# Patient Record
Sex: Male | Born: 1960 | Race: White | Hispanic: No | Marital: Single | Smoking: Current every day smoker
Health system: Southern US, Community
[De-identification: ages and names within clinical notes are randomized; demographics above are authoritative.]

## PROBLEM LIST (undated history)

## (undated) ENCOUNTER — Emergency Department: Discharge status: Absent without leave | Payer: Medicare Other

## (undated) DIAGNOSIS — K219 Gastro-esophageal reflux disease without esophagitis: Secondary | ICD-10-CM

## (undated) DIAGNOSIS — N529 Male erectile dysfunction, unspecified: Secondary | ICD-10-CM

## (undated) DIAGNOSIS — C801 Malignant (primary) neoplasm, unspecified: Secondary | ICD-10-CM

## (undated) DIAGNOSIS — I10 Essential (primary) hypertension: Secondary | ICD-10-CM

## (undated) DIAGNOSIS — D649 Anemia, unspecified: Secondary | ICD-10-CM

## (undated) HISTORY — PX: BACK SURGERY: SHX140

## (undated) HISTORY — PX: NECK SURGERY: SHX720

---

## 2013-10-08 ENCOUNTER — Ambulatory Visit: Payer: Self-pay | Admitting: Internal Medicine

## 2014-05-14 ENCOUNTER — Ambulatory Visit
Admit: 2014-05-14 | Disposition: A | Discharge status: Home / Self Care | Payer: Self-pay | Attending: Gastroenterology | Admitting: Gastroenterology

## 2014-05-20 LAB — SURGICAL PATHOLOGY

## 2014-06-05 ENCOUNTER — Emergency Department: Payer: Medicare Other

## 2014-06-05 ENCOUNTER — Encounter: Payer: Self-pay | Admitting: *Deleted

## 2014-06-05 ENCOUNTER — Inpatient Hospital Stay
Admission: EM | Admit: 2014-06-05 | Discharge: 2014-06-07 | DRG: 643 | Disposition: A | Discharge status: Home / Self Care | Payer: Medicare Other | Attending: Internal Medicine | Admitting: Internal Medicine

## 2014-06-05 DIAGNOSIS — E222 Syndrome of inappropriate secretion of antidiuretic hormone: Secondary | ICD-10-CM | POA: Diagnosis not present

## 2014-06-05 DIAGNOSIS — G9341 Metabolic encephalopathy: Secondary | ICD-10-CM | POA: Diagnosis present

## 2014-06-05 DIAGNOSIS — K219 Gastro-esophageal reflux disease without esophagitis: Secondary | ICD-10-CM | POA: Diagnosis present

## 2014-06-05 DIAGNOSIS — I10 Essential (primary) hypertension: Secondary | ICD-10-CM | POA: Diagnosis present

## 2014-06-05 DIAGNOSIS — R4182 Altered mental status, unspecified: Secondary | ICD-10-CM | POA: Diagnosis not present

## 2014-06-05 DIAGNOSIS — F17210 Nicotine dependence, cigarettes, uncomplicated: Secondary | ICD-10-CM | POA: Diagnosis present

## 2014-06-05 DIAGNOSIS — G9340 Encephalopathy, unspecified: Secondary | ICD-10-CM | POA: Diagnosis present

## 2014-06-05 DIAGNOSIS — E785 Hyperlipidemia, unspecified: Secondary | ICD-10-CM | POA: Diagnosis present

## 2014-06-05 DIAGNOSIS — E871 Hypo-osmolality and hyponatremia: Secondary | ICD-10-CM | POA: Diagnosis present

## 2014-06-05 DIAGNOSIS — T502X5A Adverse effect of carbonic-anhydrase inhibitors, benzothiadiazides and other diuretics, initial encounter: Secondary | ICD-10-CM | POA: Diagnosis present

## 2014-06-05 HISTORY — DX: Gastro-esophageal reflux disease without esophagitis: K21.9

## 2014-06-05 HISTORY — DX: Essential (primary) hypertension: I10

## 2014-06-05 LAB — CBC WITH DIFFERENTIAL/PLATELET
BASOS ABS: 0.1 10*3/uL (ref 0–0.1)
BASOS PCT: 1 %
EOS ABS: 0.1 10*3/uL (ref 0–0.7)
EOS PCT: 2 %
HEMATOCRIT: 37.2 % — AB (ref 40.0–52.0)
HEMOGLOBIN: 13.2 g/dL (ref 13.0–18.0)
Lymphocytes Relative: 13 %
Lymphs Abs: 1 10*3/uL (ref 1.0–3.6)
MCH: 29.3 pg (ref 26.0–34.0)
MCHC: 35.4 g/dL (ref 32.0–36.0)
MCV: 82.8 fL (ref 80.0–100.0)
Monocytes Absolute: 1 10*3/uL (ref 0.2–1.0)
Monocytes Relative: 13 %
Neutro Abs: 5.2 10*3/uL (ref 1.4–6.5)
Neutrophils Relative %: 71 %
PLATELETS: 307 10*3/uL (ref 150–440)
RBC: 4.49 MIL/uL (ref 4.40–5.90)
RDW: 16.1 % — AB (ref 11.5–14.5)
WBC: 7.4 10*3/uL (ref 3.8–10.6)

## 2014-06-05 LAB — AMMONIA: AMMONIA: 10 umol/L (ref 9–35)

## 2014-06-05 LAB — LACTIC ACID, PLASMA: Lactic Acid, Venous: 1.8 mmol/L (ref 0.5–2.0)

## 2014-06-05 MED ORDER — LORAZEPAM 2 MG/ML IJ SOLN
1.0000 mg | Freq: Once | INTRAMUSCULAR | Status: AC
  Administered 2014-06-05: 1 mg via INTRAVENOUS

## 2014-06-05 MED ORDER — SODIUM CHLORIDE 0.9 % IV BOLUS (SEPSIS)
1000.0000 mL | Freq: Once | INTRAVENOUS | Status: AC
  Administered 2014-06-05: 1000 mL via INTRAVENOUS

## 2014-06-05 MED ORDER — LORAZEPAM 2 MG/ML IJ SOLN
INTRAMUSCULAR | Status: AC
  Filled 2014-06-05: qty 1

## 2014-06-05 MED ORDER — LORAZEPAM 2 MG/ML IJ SOLN
INTRAMUSCULAR | Status: AC
  Administered 2014-06-05: 1 mg via INTRAVENOUS
  Filled 2014-06-05: qty 1

## 2014-06-05 MED ORDER — HALOPERIDOL LACTATE 5 MG/ML IJ SOLN
10.0000 mg | Freq: Once | INTRAMUSCULAR | Status: AC
  Administered 2014-06-05: 10 mg via INTRAVENOUS

## 2014-06-05 MED ORDER — HALOPERIDOL LACTATE 5 MG/ML IJ SOLN
INTRAMUSCULAR | Status: AC
  Administered 2014-06-05: 10 mg via INTRAVENOUS
  Filled 2014-06-05: qty 2

## 2014-06-05 NOTE — ED Notes (Signed)
Pt went to CT but CT was not performed d/t pt not lying still. Ativan '1mg'$  was given minutes prior to pt going to CT. Pt back to room with ED tech. ED tech remains with pt.

## 2014-06-05 NOTE — ED Notes (Signed)
Pt was discovered by his brother this evening to be confused and not making any sense. Pt alert but not oriented. Pt will answer when you call his name but cannot tell his name or DOB. Pt was last known normal yest at his PCP. Pt has equal grips and steady gait, speech normal, no facial droop observed. Pt continually wants to leave and states he is going home now but with redirection pt will remain cooperative and not try to leave. ED tech remains with pt at this time for safety.

## 2014-06-05 NOTE — ED Notes (Signed)
Pt's brother Gershon Mussel at bedside.

## 2014-06-05 NOTE — ED Provider Notes (Signed)
[facility] Emergency Department Provider Note  ____________________________________________  Time seen: Approximately 7:43 PM  I have reviewed the triage vital signs and the nursing notes.   HISTORY  Chief Complaint No chief complaint on file.  Altered Mental status  HPI Micah Barnier is a 54 y.o. male EMS called by patient's brother who reports he has altered mental status was last seen normal at the doctor's office yesterday when he had his regular medications. Unsure of the start of the altered mental status patient himself complains of pain in his right knee will not let me move his right knee EMS reports he was moving his knees well when he was walking and walked without difficulty to the truck although he changed his mind and walk back and forth several times this reports he became combative at one point as well he himself denies headache fever nausea vomiting diarrhea told me initially his right knee hurt and said both knees hurt and see her density hurt all over and then said he felt fine patient is usually responding in 1-2 word sentences, monotone voice M unable to this get any other history out of him  Patient is to combative and restless to lay still 1 mg of Ativan IV Ativan did not control him so he was given another milligram of IV Ativan and IV Haldol is let's him relax and CT is able to be accomplished we are still waiting on labs and will I will sign out the patient to Dr. Owens Shark for further disposition anticipate admitting this patient   No past medical history on file.  There are no active problems to display for this patient.   No past surgical history on file.  No current outpatient prescriptions on file.  Allergies Review of patient's allergies indicates not on file.  No family history on file.  Social History History  Substance Use Topics  . Smoking status: Not on file  . Smokeless tobacco: Not on file  . Alcohol Use: Not on file     Review of Systems Review of systems is limited severely by patient's altered mental status fact that he cannot give me the same answer twice ____________________________________________   PHYSICAL EXAM: There were no vitals taken for this visit.VITAL SIGNS:BP 117/65 mmHg  Pulse 60  Temp(Src) 98.2 F (36.8 C) (Oral)  Resp 12  SpO2 98% ED Triage Vitals  Enc Vitals Group     BP --      Pulse --      Resp --      Temp --      Temp src --      SpO2 --      Weight --      Height --      Head Cir --      Peak Flow --      Pain Score --      Pain Loc --      Pain Edu? --      Excl. in [place]? --     Constitutional: Alert and oriented. Well appearing and in no acute distress. Eyes: Conjunctivae are normal. PERRL. EOMI. Head: Atraumatic. Nose: No congestion/rhinnorhea. Mouth/Throat: Mucous membranes are dry   Oropharynx non-erythematous. Neck: No stridor.  unable to manipulate patient's neck because he won't let me but he moves his neck and normally himself Cardiovascular: Normal rate, regular rhythm. Grossly normal heart sounds.  Good peripheral circulation. Respiratory: Normal respiratory effort.  No retractions. Lungs CTAB. Gastrointestinal: Soft and  nontender. No distention. No abdominal bruits. No CVA tenderness. Musculoskeletal: No lower extremity tenderness nor edema.  No joint effusions. Neurologic:  Normal speech and language. No gross focal neurologic deficits are appreciated.No gait instability. Skin:  Skin is warm, dry and intact. No rash noted. however his skin is erythematous as like he's been out in the sun all day    ____________________________________________   LABS (all labs ordered are listed, but only abnormal results are displayed)  Labs Reviewed  CBC WITH DIFFERENTIAL/PLATELET  COMPREHENSIVE METABOLIC PANEL  AMMONIA  LACTIC ACID, PLASMA  LACTIC ACID, PLASMA  TROPONIN I  URINALYSIS COMPLETEWITH MICROSCOPIC ([facility])   URINE DRUG SCREEN,  QUALITATIVE ([facility])  CBG MONITORING, ED   ____________________________________________  EKG normal sinus rhythm at 81 normal axis and no acute changes normal QRS duration EKG is computer reads septal infarct that's probably due to lead placement____________________________________________  RADIOLOGY  CT read by radiologist as no acute disease discussed in detail with radiologist who again feels the slight hypodensity in the frontal lobe area is artifact from theskull ____________________________________________   PROCEDURES  Procedure(s) performed: None  Critical Care performed: No  ____________________________________________   INITIAL IMPRESSION / ASSESSMENT AND PLAN / ED COURSE  Pertinent labs & imaging results that were available during my care of the patient were reviewed by me and considered in my medical decision making (see chart for details).   ____________________________________________   FINAL CLINICAL IMPRESSION(S) / ED DIAGNOSES  Final diagnoses:  None     Nena Polio, MD 06/05/14 2322

## 2014-06-05 NOTE — ED Notes (Signed)
This RN spoke with pt's brother, Ra Pfiester. Tom states pt was nl when he left for work at [employer] this a.m. Pt was quiet and not acting quite right when he arrived home. Brother called EMS. Brother advised that further tests are pending but I will call him if pt is admitted or when he is ready for d/c. Tom agreeable with this and is going home at this time. Atilano Median [phone]. Pt is asleep at this time. Cardiac monitor remains in place, door to room open.

## 2014-06-05 NOTE — ED Notes (Signed)
Patient is resting comfortably. Ed tech remains at bedside.

## 2014-06-05 NOTE — ED Notes (Signed)
Patient transported to X-ray 

## 2014-06-05 NOTE — ED Notes (Signed)
Bed alarm in place. Door to room open. Cardiac monitor in remains in place.

## 2014-06-06 ENCOUNTER — Encounter: Payer: Self-pay | Admitting: Internal Medicine

## 2014-06-06 DIAGNOSIS — E222 Syndrome of inappropriate secretion of antidiuretic hormone: Secondary | ICD-10-CM | POA: Diagnosis present

## 2014-06-06 DIAGNOSIS — E785 Hyperlipidemia, unspecified: Secondary | ICD-10-CM | POA: Diagnosis present

## 2014-06-06 DIAGNOSIS — G9340 Encephalopathy, unspecified: Secondary | ICD-10-CM | POA: Diagnosis present

## 2014-06-06 DIAGNOSIS — T502X5A Adverse effect of carbonic-anhydrase inhibitors, benzothiadiazides and other diuretics, initial encounter: Secondary | ICD-10-CM | POA: Diagnosis present

## 2014-06-06 DIAGNOSIS — K219 Gastro-esophageal reflux disease without esophagitis: Secondary | ICD-10-CM | POA: Diagnosis present

## 2014-06-06 DIAGNOSIS — F17210 Nicotine dependence, cigarettes, uncomplicated: Secondary | ICD-10-CM | POA: Diagnosis present

## 2014-06-06 DIAGNOSIS — E871 Hypo-osmolality and hyponatremia: Secondary | ICD-10-CM | POA: Diagnosis present

## 2014-06-06 DIAGNOSIS — I10 Essential (primary) hypertension: Secondary | ICD-10-CM | POA: Diagnosis present

## 2014-06-06 DIAGNOSIS — R4182 Altered mental status, unspecified: Secondary | ICD-10-CM | POA: Diagnosis present

## 2014-06-06 DIAGNOSIS — G9341 Metabolic encephalopathy: Secondary | ICD-10-CM | POA: Diagnosis present

## 2014-06-06 LAB — COMPREHENSIVE METABOLIC PANEL
ALT: 14 U/L — ABNORMAL LOW (ref 17–63)
ALT: 11 U/L — ABNORMAL LOW (ref 17–63)
AST: 29 U/L (ref 15–41)
AST: 20 U/L (ref 15–41)
Albumin: 4.8 g/dL (ref 3.5–5.0)
Albumin: 3.9 g/dL (ref 3.5–5.0)
Alkaline Phosphatase: 115 U/L (ref 38–126)
Alkaline Phosphatase: 104 U/L (ref 38–126)
Anion gap: 19 — ABNORMAL HIGH (ref 5–15)
Anion gap: 8 (ref 5–15)
BILIRUBIN TOTAL: 0.8 mg/dL (ref 0.3–1.2)
BUN: 15 mg/dL (ref 6–20)
BUN: 13 mg/dL (ref 6–20)
CALCIUM: 9.6 mg/dL (ref 8.9–10.3)
CHLORIDE: 85 mmol/L — AB (ref 101–111)
CO2: 18 mmol/L — ABNORMAL LOW (ref 22–32)
CO2: 26 mmol/L (ref 22–32)
CREATININE: 0.86 mg/dL (ref 0.61–1.24)
Calcium: 8.5 mg/dL — ABNORMAL LOW (ref 8.9–10.3)
Chloride: 85 mmol/L — ABNORMAL LOW (ref 101–111)
Creatinine, Ser: 1.15 mg/dL (ref 0.61–1.24)
GFR calc Af Amer: >60 mL/min (ref >60)
GFR calc non Af Amer: >60 mL/min (ref >60)
GLUCOSE: 135 mg/dL — AB (ref 65–99)
Glucose, Bld: 92 mg/dL (ref 65–99)
POTASSIUM: 3.5 mmol/L (ref 3.5–5.1)
Potassium: 3.5 mmol/L (ref 3.5–5.1)
SODIUM: 122 mmol/L — AB (ref 135–145)
Sodium: 119 mmol/L — CL (ref 135–145)
TOTAL PROTEIN: 8.5 g/dL — AB (ref 6.5–8.1)
Total Bilirubin: 0.2 mg/dL — ABNORMAL LOW (ref 0.3–1.2)
Total Protein: 7.1 g/dL (ref 6.5–8.1)

## 2014-06-06 LAB — BASIC METABOLIC PANEL
ANION GAP: 5 (ref 5–15)
BUN: 11 mg/dL (ref 6–20)
CALCIUM: 8.5 mg/dL — AB (ref 8.9–10.3)
CO2: 26 mmol/L (ref 22–32)
CREATININE: 0.79 mg/dL (ref 0.61–1.24)
Chloride: 91 mmol/L — ABNORMAL LOW (ref 101–111)
GFR calc Af Amer: >60 mL/min (ref >60)
Glucose, Bld: 106 mg/dL — ABNORMAL HIGH (ref 65–99)
Potassium: 4 mmol/L (ref 3.5–5.1)
Sodium: 122 mmol/L — ABNORMAL LOW (ref 135–145)

## 2014-06-06 LAB — TROPONIN I
Troponin I: <0.03 ng/mL (ref <0.031)
Troponin I: <0.03 ng/mL (ref <0.031)

## 2014-06-06 LAB — CK: CK TOTAL: 122 U/L (ref 49–397)

## 2014-06-06 LAB — NA AND K (SODIUM & POTASSIUM), RAND UR
Potassium Urine: 6 mmol/L
SODIUM UR: 39 mmol/L

## 2014-06-06 LAB — URINALYSIS COMPLETE WITH MICROSCOPIC (ARMC ONLY)
BACTERIA UA: NONE SEEN
Bilirubin Urine: NEGATIVE
GLUCOSE, UA: NEGATIVE mg/dL
HGB URINE DIPSTICK: NEGATIVE
KETONES UR: NEGATIVE mg/dL
Leukocytes, UA: NEGATIVE
Nitrite: NEGATIVE
Protein, ur: NEGATIVE mg/dL
RBC / HPF: NONE SEEN RBC/hpf (ref 0–5)
SPECIFIC GRAVITY, URINE: 1.011 (ref 1.005–1.030)
SQUAMOUS EPITHELIAL / LPF: NONE SEEN
pH: 6 (ref 5.0–8.0)

## 2014-06-06 LAB — URINE DRUG SCREEN, QUALITATIVE (ARMC ONLY)
Amphetamines, Ur Screen: NOT DETECTED
BENZODIAZEPINE, UR SCRN: NOT DETECTED
Barbiturates, Ur Screen: NOT DETECTED
CANNABINOID 50 NG, UR ~~LOC~~: NOT DETECTED
Cocaine Metabolite,Ur ~~LOC~~: NOT DETECTED
MDMA (ECSTASY) UR SCREEN: NOT DETECTED
Methadone Scn, Ur: NOT DETECTED
Opiate, Ur Screen: NOT DETECTED
Phencyclidine (PCP) Ur S: NOT DETECTED
TRICYCLIC, UR SCREEN: POSITIVE — AB

## 2014-06-06 LAB — CHLORIDE, URINE, RANDOM: Chloride Urine: 40 mmol/L

## 2014-06-06 LAB — TSH: TSH: 2.29 u[IU]/mL (ref 0.350–4.500)

## 2014-06-06 LAB — OSMOLALITY, URINE: Osmolality, Ur: 156 mOsm/kg — ABNORMAL LOW (ref 300–900)

## 2014-06-06 LAB — ETHANOL: Alcohol, Ethyl (B): <5 mg/dL (ref <5)

## 2014-06-06 MED ORDER — SODIUM CHLORIDE 0.9 % IV SOLN
1000.0000 mL | Freq: Once | INTRAVENOUS | Status: AC
  Administered 2014-06-06: 1000 mL via INTRAVENOUS

## 2014-06-06 MED ORDER — SODIUM CHLORIDE 0.9 % IV SOLN
1000.0000 mL | INTRAVENOUS | Status: DC
  Administered 2014-06-06: 1000 mL via INTRAVENOUS

## 2014-06-06 MED ORDER — AMLODIPINE BESYLATE 5 MG PO TABS
5.0000 mg | ORAL_TABLET | Freq: Every day | ORAL | Status: DC
  Administered 2014-06-06 – 2014-06-07 (×2): 5 mg via ORAL
  Filled 2014-06-06 (×2): qty 1

## 2014-06-06 MED ORDER — ENOXAPARIN SODIUM 40 MG/0.4ML ~~LOC~~ SOLN
40.0000 mg | SUBCUTANEOUS | Status: DC
  Administered 2014-06-06 – 2014-06-07 (×2): 40 mg via SUBCUTANEOUS
  Filled 2014-06-06 (×2): qty 0.4

## 2014-06-06 MED ORDER — ONDANSETRON HCL 4 MG PO TABS: 4.0000 mg | ORAL_TABLET | Freq: Four times a day (QID) | ORAL | Status: DC | PRN

## 2014-06-06 MED ORDER — PANTOPRAZOLE SODIUM 40 MG PO TBEC
40.0000 mg | DELAYED_RELEASE_TABLET | Freq: Every day | ORAL | Status: DC
  Administered 2014-06-06 – 2014-06-07 (×2): 40 mg via ORAL
  Filled 2014-06-06 (×2): qty 1

## 2014-06-06 MED ORDER — SODIUM CHLORIDE 0.9 % IJ SOLN
3.0000 mL | Freq: Two times a day (BID) | INTRAMUSCULAR | Status: DC
  Administered 2014-06-06 – 2014-06-07 (×2): 3 mL via INTRAVENOUS

## 2014-06-06 MED ORDER — ALBUTEROL SULFATE HFA 108 (90 BASE) MCG/ACT IN AERS: 2.0000 | INHALATION_SPRAY | Freq: Four times a day (QID) | RESPIRATORY_TRACT | Status: DC | PRN

## 2014-06-06 MED ORDER — ATORVASTATIN CALCIUM 20 MG PO TABS
20.0000 mg | ORAL_TABLET | Freq: Every day | ORAL | Status: DC
  Administered 2014-06-06: 20 mg via ORAL
  Filled 2014-06-06: qty 1

## 2014-06-06 MED ORDER — ASPIRIN EC 81 MG PO TBEC
81.0000 mg | DELAYED_RELEASE_TABLET | Freq: Every day | ORAL | Status: DC
  Administered 2014-06-06 – 2014-06-07 (×2): 81 mg via ORAL
  Filled 2014-06-06 (×2): qty 1

## 2014-06-06 MED ORDER — ACETAMINOPHEN 325 MG PO TABS: 650.0000 mg | ORAL_TABLET | Freq: Four times a day (QID) | ORAL | Status: DC | PRN

## 2014-06-06 MED ORDER — ONDANSETRON HCL 4 MG/2ML IJ SOLN: 4.0000 mg | Freq: Four times a day (QID) | INTRAMUSCULAR | Status: DC | PRN

## 2014-06-06 MED ORDER — ALBUTEROL SULFATE (2.5 MG/3ML) 0.083% IN NEBU: 2.5000 mg | INHALATION_SOLUTION | Freq: Four times a day (QID) | RESPIRATORY_TRACT | Status: DC | PRN

## 2014-06-06 MED ORDER — ACETAMINOPHEN 650 MG RE SUPP: 650.0000 mg | Freq: Four times a day (QID) | RECTAL | Status: DC | PRN

## 2014-06-06 MED ORDER — LISINOPRIL 20 MG PO TABS
20.0000 mg | ORAL_TABLET | Freq: Every day | ORAL | Status: DC
  Administered 2014-06-06 – 2014-06-07 (×2): 20 mg via ORAL
  Filled 2014-06-06 (×2): qty 1

## 2014-06-06 NOTE — ED Notes (Signed)
Admitting MD at bedside.

## 2014-06-06 NOTE — H&P (Signed)
[facility] History and Physical  Shizuo Biskup OIB:[id number] DOB: 1961/01/14 DOA: 06/05/2014  Referring physician: Dr. Owens Shark PCP: No primary care provider on file. [facility]  Chief Complaint: Altered mental status  HPI: Steven Davis is a 54 y.o. male below past medical history brought in with the complaints of altered mental status as noted by his brother. The ED physician's note patient was agitated and combative on arrival . Workup revealed a CT head which is negative for any acute intracranial pathology. Patient was monitored in the emergency room and his altered sensorium gradually improved. No history of any fever, cough, shortness of breath, nausea vomiting diarrhea constipation. His lab work came back and was significant for hyponatremia. Rest of his labs were normal including EtOH level and urine toxicology screen . Patient was given normal saline bolus and hospitalist service was consulted for further management. At the current time patient is alert awake and oriented 3, denies any complaints, states he wants to go home .  Past Medical History  Diagnosis Date  . Hypertension   . GERD (gastroesophageal reflux disease)     Past Surgical History  Procedure Laterality Date  . Back surgery      Family History  Problem Relation Age of Onset  . Hypertension Father     Social History  reports that he has been smoking.  He does not have any smokeless tobacco history on file. He reports that he does not drink alcohol. His drug history is not on file.  No Known Allergies  Prior to Admission medications   Medication Sig Start Date End Date Taking? Authorizing Provider  albuterol (PROVENTIL HFA;VENTOLIN HFA) 108 (90 BASE) MCG/ACT inhaler Inhale 2 puffs into the lungs every 6 (six) hours as needed for wheezing or shortness of breath.   Yes Historical Provider, MD  amLODipine (NORVASC) 5 MG tablet Take 5 mg by mouth daily.   Yes Historical Provider, MD  atorvastatin  (LIPITOR) 20 MG tablet Take 20 mg by mouth at bedtime.   Yes Historical Provider, MD  cyclobenzaprine (FLEXERIL) 10 MG tablet Take 10 mg by mouth 4 (four) times daily.   Yes Historical Provider, MD  lisinopril-hydrochlorothiazide (PRINZIDE,ZESTORETIC) 20-12.5 MG per tablet Take 2 tablets by mouth daily.   Yes Historical Provider, MD  omeprazole (PRILOSEC) 40 MG capsule Take 40 mg by mouth daily.   Yes Historical Provider, MD  tadalafil (CIALIS) 10 MG tablet Take 10 mg by mouth daily as needed for erectile dysfunction.   Yes Historical Provider, MD     Review of Systems: Patient was noted to be with altered sensorium on arrival in the ED by the ED physician. Currently patient is alert awake and oriented. Constitutional: No fevers, chills, fatigue, weakness.  Eyes: No blurred or double vision, pain, redness, inflammation. ENT: No tinnitus, ear pain, hearing loss, epistaxis, discharge, redness of oropharynx, difficulty swallowing. Resp: No cough, wheeze, hemoptysis, dyspnea, painful respiration. Cardio-vascular: No chest pain, orthopnea, edema, DOE, palpitations, syncope. GI: No nausea, vomiting, diarrhea, abdominal pain, hematemesis, melena, GERD, rectal bleeding, constipation. GU: No dysuria, hematuria, urgency, frequency, incontinence Endocrine: No polyuria, nocturia, heat or cold intolerance, thirst Hematologic/Lymphatic: No anemia, easy bruising bleeding, swollen glands Integumentary: No acne, rash, lesions. Musculoskeletal: No pain in: neck-back-shoulder-knee-hip, arthritis, gout, redness. Neuro: No numbness, weakness, dysarthria, epilepsy, tremor, vertigo, ataxia, dementia, headache, migraine, CVA, TIA, seizure, memory loss Psych: No anxiety, insomnia, ADD, OCD, bipolar, depression, schizophrenia.   Physical Exam: Constitutional: Filed Vitals:   06/06/14 0300 06/06/14 0400 06/06/14 0500  06/06/14 0600  BP: 103/67 114/70 137/78 106/64  Pulse: 58  63 63  Temp:      TempSrc:       Resp: '12 14 14 12  '$ SpO2: 98%  97% 97%   Wt Readings from Last 3 Encounters:  No data found for Wt   General:  Well developed, well nourished,  in no apparent distress HEENT: PERRL, EOMI, no scleral icterus, no conjunctivitis, no difficulty hearing, no pharyngeal erythema, Mucosa - moist. Neck:  Supple, no masses, non tender, no adenopathy, no JVD, no Carotid bruit, no thyromegaly,FROM Cardiovascular: RRR, no m/r/g, no S3/S4, chest wall nontender, good pedal pulses, good femoral pulses, no LE edema. Respiratory: CTA bilaterally, no wheeze, no rales, no ronchi, breath sounds not diminished, breathing not labored, no increased respiratory effort. Abdomen: soft, nontender, nondistended, no mass, bowel sounds present and normoactive, no hepatosplenomegaly. GU/Gyn: Not examined. Musculoskeletal: 5/5 muscular strength x4 extremities, no cyanosis/clubbing. Skin: no rash, no lesions, no erythema. Lymphatic: No adenopathy (cervical/axilla/inguinal/supraclavicular) Neurologic: Cranial nerves intact, DTR intact, Sensation intact, Babinski sign R/L, no dysarthria, no aphasia, no dysphagia, no contractures Psychiatric: Alert, Oriented (time, person, place, circumstance), Cooperative, Judgement good, Memory intact, not confused, not agitated, not depressed, cooperative         Labs on Admission:  Basic Metabolic Panel:  Recent Labs Lab 06/06/14 0500  NA 119*  K 3.5  CL 85*  CO2 26  GLUCOSE 92  BUN 13  CREATININE 0.86  CALCIUM 8.5*   Liver Function Tests:  Recent Labs Lab 06/06/14 0500  AST 20  ALT 11*  ALKPHOS 104  BILITOT 0.8  PROT 7.1  ALBUMIN 3.9   No results for input(s): LIPASE, AMYLASE in the last 168 hours.  Recent Labs Lab 06/05/14 2030  AMMONIA 10   CBC:  Recent Labs Lab 06/05/14 1949  WBC 7.4  NEUTROABS 5.2  HGB 13.2  HCT 37.2*  MCV 82.8  PLT 307   Cardiac Enzymes:  Recent Labs Lab 06/05/14 1949 06/06/14 0500  CKTOTAL 122  --   TROPONINI  --   <0.03   BNP (last 3 results) No results for input(s): BNP in the last 8760 hours.  ProBNP (last 3 results) No results for input(s): PROBNP in the last 8760 hours.  CBG: No results for input(s): GLUCAP in the last 168 hours.  Radiological Exams on Admission: Ct Head Wo Contrast  06/05/2014   CLINICAL DATA:  Altered mental status.  EXAM: CT HEAD WITHOUT CONTRAST  TECHNIQUE: Contiguous axial images were obtained from the base of the skull through the vertex without intravenous contrast.  COMPARISON:  None.  FINDINGS: Images are somewhat limited due to persistent patient motion artifact. Bony calvarium appears grossly intact. No mass effect or midline shift is noted. Ventricular size is within normal limits. There is no evidence of mass lesion, hemorrhage or acute infarction.  IMPRESSION: No gross intracranial abnormality seen.   Electronically Signed   By: Marijo Conception, M.D.   On: 06/05/2014 20:34   Dg Chest Portable 1 View  06/05/2014   CLINICAL DATA:  Altered mental status.  Hypertension.  EXAM: PORTABLE CHEST - 1 VIEW  COMPARISON:  None.  FINDINGS: The heart size and mediastinal contours are within normal limits. Both lungs are clear. Posterior spinal fixation rods noted in the lower thoracic spine.  IMPRESSION: No active disease.   Electronically Signed   By: Earle Gell M.D.   On: 06/05/2014 20:12    EKG: Independently reviewed. Normal sinus  rhythm with ventricular rate of 87 bpm. No acute ischemic changes.  Assessment/Plan Principal Problem:   Hyponatremia Active Problems:   Acute encephalopathy   HTN (hypertension)   GERD (gastroesophageal reflux disease)   1. Acute encephalopathy likely secondary to hyponatremia. Currently patient is alert awake and oriented 3. 2. Hyponatremia, etiology not clear at this time. Likely secondary to diuretic usage. 3. Hypertension, controlled on home medications.  4. Hyperlipidemia, stable on statin. 5. Gastroesophageal reflux disease, stable  on PPI. 6. Tobacco usage, continuous patient not motivated to quit.  Plan: Admit to medical floor, telemetry monitoring, neuro watch, seizure precautions. Continue IV normal saline, follow-up CMP. Check TSH. Hold diuretics for now, continue other home medications.   Code Status: Full code DVT Prophylaxis: SubQ lovenox  Time spent on admission: 50 minutes.  Juluis Mire [facility]

## 2014-06-06 NOTE — Care Management Note (Signed)
Case Management Note  Patient Details  Name: Steven Davis MRN: [id number] Date of Birth: 20-Jan-1961  Subjective/Objective:                  Patient awake but dosing off during [facility] assessment. Brother at bedside. Patient answered questions related to [facility] assessment. Patient states he lives with this brother, active, drives, and able to perform ADLs without assistance. His PCP is with [facility] and he states that he can afford his Rx. He states that he "drinks non-alcohol beers" but "has a history of alcohol abuse". His brother offered no insight. He states his Sodium has dropped like this in the past. Patient eager to return home.  Action/Plan: No RNCM needs.   Expected Discharge Date:                  Expected Discharge Plan:  Home/Self Care  In-House Referral:     Discharge planning Services  CM Consult  Post Acute Care Choice:    Choice offered to:  Patient, Sibling  DME Arranged:    DME Agency:     HH Arranged:    [place] Agency:     Status of Service:  Completed, signed off  Medicare Important Message Given:    Date Medicare IM Given:    Medicare IM give by:    Date Additional Medicare IM Given:    Additional Medicare Important Message give by:     If discussed at [place] of Stay Meetings, dates discussed:    Additional Comments:  Marshell Garfinkel, RN 06/06/2014, 10:24 AM

## 2014-06-06 NOTE — Progress Notes (Signed)
[place] [facility] [place] NAME: Steven Davis    MR#:  [id number]  DATE OF BIRTH:  1961-01-02  SUBJECTIVE:  CHIEF COMPLAINT:   Chief Complaint  Patient presents with  . Altered Mental Status    REVIEW OF SYSTEMS:  Review of Systems  Constitutional: Negative for fever, chills and weight loss.  HENT: Negative for nosebleeds and sore throat.   Eyes: Negative for blurred vision.  Respiratory: Negative for cough, shortness of breath and wheezing.   Cardiovascular: Negative for chest pain, orthopnea, leg swelling and PND.  Gastrointestinal: Negative for heartburn, nausea, vomiting, abdominal pain, diarrhea and constipation.  Genitourinary: Negative for dysuria and urgency.  Musculoskeletal: Negative for back pain.  Skin: Negative for rash.  Neurological: Negative for dizziness, speech change, focal weakness and headaches.  Endo/Heme/Allergies: Does not bruise/bleed easily.  Psychiatric/Behavioral: Negative for depression.    DRUG ALLERGIES:  No Known Allergies  VITALS:  Blood pressure 141/82, pulse 77, temperature 97.8 F (36.6 C), temperature source Oral, resp. rate 18, weight 69.854 kg (154 lb), SpO2 97 %.  PHYSICAL EXAMINATION:  GENERAL:  54 y.o.-year-old patient lying in the bed with no acute distress.  EYES: Pupils equal, round, reactive to light and accommodation. No scleral icterus. Extraocular muscles intact.  HEENT: Head atraumatic, normocephalic. Oropharynx and nasopharynx clear.  NECK:  Supple, no jugular venous distention. No thyroid enlargement, no tenderness.  LUNGS: Normal breath sounds bilaterally, no wheezing, rales,rhonchi or crepitation. No use of accessory muscles of respiration.  CARDIOVASCULAR: S1, S2 normal. No murmurs, rubs, or gallops.  ABDOMEN: Soft, nontender, nondistended. Bowel sounds present. No organomegaly or mass.  EXTREMITIES: No pedal edema, cyanosis, or clubbing.  NEUROLOGIC: Cranial nerves II  through XII are intact. Muscle strength 5/5 in all extremities. Sensation intact. Gait not checked.  PSYCHIATRIC: The patient is alert and oriented x 3. He does doze off very easily while talking.  SKIN: No obvious rash, lesion, or ulcer.    LABORATORY PANEL:   CBC  Recent Labs Lab 06/05/14 1949  WBC 7.4  HGB 13.2  HCT 37.2*  PLT 307   ------------------------------------------------------------------------------------------------------------------  Chemistries   Recent Labs Lab 06/06/14 0500  NA 119*  K 3.5  CL 85*  CO2 26  GLUCOSE 92  BUN 13  CREATININE 0.86  CALCIUM 8.5*  AST 20  ALT 11*  ALKPHOS 104  BILITOT 0.8   ------------------------------------------------------------------------------------------------------------------  Cardiac Enzymes  Recent Labs Lab 06/06/14 0500  TROPONINI <0.03   ------------------------------------------------------------------------------------------------------------------  RADIOLOGY:  Ct Head Wo Contrast  06/05/2014   CLINICAL DATA:  Altered mental status.  EXAM: CT HEAD WITHOUT CONTRAST  TECHNIQUE: Contiguous axial images were obtained from the base of the skull through the vertex without intravenous contrast.  COMPARISON:  None.  FINDINGS: Images are somewhat limited due to persistent patient motion artifact. Bony calvarium appears grossly intact. No mass effect or midline shift is noted. Ventricular size is within normal limits. There is no evidence of mass lesion, hemorrhage or acute infarction.  IMPRESSION: No gross intracranial abnormality seen.   Electronically Signed   By: Marijo Conception, M.D.   On: 06/05/2014 20:34   Dg Chest Portable 1 View  06/05/2014   CLINICAL DATA:  Altered mental status.  Hypertension.  EXAM: PORTABLE CHEST - 1 VIEW  COMPARISON:  None.  FINDINGS: The heart size and mediastinal contours are within normal limits. Both lungs are clear. Posterior spinal fixation rods noted in the lower thoracic  spine.  IMPRESSION: No active disease.   Electronically Signed   By: Earle Gell M.D.   On: 06/05/2014 20:12    ASSESSMENT AND PLAN:   1. Acute encephalopathy likely multifactorial, possibly secondary to hyponatremia. Currently patient is alert awake and oriented 3. 2. Acute on chronic Hyponatremia, etiology not clear at this time. Likely secondary to diuretic usage. Consult nephrology. This is likely acute on chronic. 3. Hypertension, controlled on home medications.  4. Hyperlipidemia, stable on statin. 5. Gastroesophageal reflux disease, stable on PPI. 6. Tobacco usage, continuous patient not motivated to quit. He was counseled for 3 minutes not ready to quit. Denies any need for nicotine replacement therapy     All the records are reviewed and case discussed with Care Management/Social Workerr. Management plans discussed with the patient, family and they are in agreement.  CODE STATUS: Full  TOTAL TIME TAKING CARE OF THIS PATIENT: 45 minutes.   POSSIBLE D/C IN 1-2 DAYS, DEPENDING ON CLINICAL CONDITION.   [facility], Emet Rafanan M.D on 06/06/2014 at 12:33 PM  Between 7am to 6pm - Pager - [phone]  After 6pm go to [email or web address] - password [email or web address] [email or web address]  [facility]  Office  [phone]  CC: Primary care physician; No primary care provider on file.

## 2014-06-06 NOTE — ED Notes (Signed)
Pt assisted up to bedside with urinal. Urine specimen obtained and sent to lab. Pt able to answer questions app. Pt back in bed asleep, cardiac monitor in place. Pt has no other needs or concerns at this time.

## 2014-06-06 NOTE — Consult Note (Signed)
Date: 06/06/2014                  Patient Name:  Steven Davis  MRN: [id number]  DOB: 1960/05/05  Age / Sex: 54 y.o., male         PCP: No primary care provider on file.                 Service Requesting Consult: IM                 Reason for Consult: hyponatremia            History of Present Illness: Patient is a 54 y.o. male with a PMHx of HTN, who was admitted to [facility] on 06/05/2014 for evaluation of hyponatremia.  He first presented to the ER with agitation and he was combative upon arrival. CT head neg. Treated with iv fluids. Mental status improved in the ER At present seems to be at his baseline. Talking on the phone. Normally conversive. Denies acute problems. No baseline labs available at this time. Admit Na was 122 Same level this AM Patient denies excessive water intake.  He was on Lisinopril-HCTZ which is in hold States his meds have been the same for the last 6 months Medications: Outpatient medications: Prescriptions prior to admission  Medication Sig Dispense Refill Last Dose  . albuterol (PROVENTIL HFA;VENTOLIN HFA) 108 (90 BASE) MCG/ACT inhaler Inhale 2 puffs into the lungs every 6 (six) hours as needed for wheezing or shortness of breath.   unknown  . amLODipine (NORVASC) 5 MG tablet Take 5 mg by mouth daily.   unknown  . atorvastatin (LIPITOR) 20 MG tablet Take 20 mg by mouth at bedtime.   unknown  . cyclobenzaprine (FLEXERIL) 10 MG tablet Take 10 mg by mouth 4 (four) times daily.   unknown  . lisinopril-hydrochlorothiazide (PRINZIDE,ZESTORETIC) 20-12.5 MG per tablet Take 2 tablets by mouth daily.   unknown  . omeprazole (PRILOSEC) 40 MG capsule Take 40 mg by mouth daily.   unknown  . tadalafil (CIALIS) 10 MG tablet Take 10 mg by mouth daily as needed for erectile dysfunction.   unknown    Current medications: Current Facility-Administered Medications  Medication Dose Route Frequency Provider Last Rate Last Dose  . 0.9 %  sodium chloride infusion  1,000 mL  Intravenous Once Gregor Hams, MD       Followed by  . 0.9 %  sodium chloride infusion  1,000 mL Intravenous Continuous Gregor Hams, MD 125 mL/hr at 06/06/14 0902 1,000 mL at 06/06/14 0902  . acetaminophen (TYLENOL) tablet 650 mg  650 mg Oral Q6H PRN Juluis Mire, MD       Or  . acetaminophen (TYLENOL) suppository 650 mg  650 mg Rectal Q6H PRN Juluis Mire, MD      . albuterol (PROVENTIL) (2.5 MG/3ML) 0.083% nebulizer solution 2.5 mg  2.5 mg Nebulization Q6H PRN Juluis Mire, MD      . amLODipine (NORVASC) tablet 5 mg  5 mg Oral Daily Juluis Mire, MD   5 mg at 06/06/14 1045  . aspirin EC tablet 81 mg  81 mg Oral Daily Juluis Mire, MD   81 mg at 06/06/14 1045  . atorvastatin (LIPITOR) tablet 20 mg  20 mg Oral QHS Juluis Mire, MD      . enoxaparin (LOVENOX) injection 40 mg  40 mg Subcutaneous Q24H Juluis Mire, MD   40 mg at 06/06/14 1045  .  lisinopril (PRINIVIL,ZESTRIL) tablet 20 mg  20 mg Oral Daily Juluis Mire, MD   20 mg at 06/06/14 1045  . ondansetron (ZOFRAN) tablet 4 mg  4 mg Oral Q6H PRN Juluis Mire, MD       Or  . ondansetron [facility]) injection 4 mg  4 mg Intravenous Q6H PRN Juluis Mire, MD      . pantoprazole (PROTONIX) EC tablet 40 mg  40 mg Oral Daily Juluis Mire, MD   40 mg at 06/06/14 1045  . sodium chloride 0.9 % injection 3 mL  3 mL Intravenous Q12H Juluis Mire, MD   3 mL at 06/06/14 1044      Allergies: No Known Allergies    Past Medical History: Past Medical History  Diagnosis Date  . Hypertension   . GERD (gastroesophageal reflux disease)      Past Surgical History: Past Surgical History  Procedure Laterality Date  . Back surgery       Family History: Family History  Problem Relation Age of Onset  . Hypertension Father      Social History: History   Social History  . Marital Status: Single    Spouse Name: N/A  . Number of Children: N/A  . Years of Education: N/A   Occupational  History  . Not on file.   Social History Main Topics  . Smoking status: Current Every Day Smoker  . Smokeless tobacco: Not on file  . Alcohol Use: No  . Drug Use: Not on file  . Sexual Activity: Not on file   Other Topics Concern  . Not on file   Social History Narrative     Review of Systems: As per HPI otherwise neg. Gen: no c/o Eyes no c/o Ent: no c/o Resp: no c/o Cvs: no c/o Gi: no c/i Gu: no c/o Musck: no c/o Skin: no c/o Neuro: no c/o  Vital Signs: Blood pressure 114/64, pulse 83, temperature 98.4 F (36.9 C), temperature source Oral, resp. rate 18, weight 69.854 kg (154 lb), SpO2 95 %.  Weight trends: Filed Weights   06/06/14 0400  Weight: 69.854 kg (154 lb)    Physical Exam: General: NAD,   Head: Normocephalic, atraumatic.  Eyes: Anicteric, EOMI  Nose: Mucous membranes moist, not inflammed, nonerythematous.  Throat: Oropharynx nonerythematous, no exudate appreciated.   Neck: No deformities, masses, Supple,  Lungs:  Normal respiratory effort.   Heart: RRR. S1 and S2 normal without gallop, murmur, or rubs.  Abdomen:  BS normoactive. Soft, Nondistended, non-tender.  No masses or organomegaly.  Extremities: No pretibial edema.  Neurologic: Alert, oriented, non focal  Skin: No visible rashes, scars.    Lab results: Basic Metabolic Panel:  Recent Labs Lab 06/05/14 1949 06/06/14 0500 06/06/14 1336  NA 122* 119* 122*  K 3.5 3.5 4.0  CL 85* 85* 91*  CO2 18* 26 26  GLUCOSE 135* 92 106*  BUN '15 13 11  '$ CREATININE 1.15 0.86 0.79  CALCIUM 9.6 8.5* 8.5*    Liver Function Tests:  Recent Labs Lab 06/05/14 1949 06/06/14 0500  AST 29 20  ALT 14* 11*  ALKPHOS 115 104  BILITOT 0.2* 0.8  PROT 8.5* 7.1  ALBUMIN 4.8 3.9   No results for input(s): LIPASE, AMYLASE in the last 168 hours.  Recent Labs Lab 06/05/14 2030  AMMONIA 10    CBC:  Recent Labs Lab 06/05/14 1949  WBC 7.4  NEUTROABS 5.2  HGB 13.2  HCT 37.2*  MCV 82.8  PLT 307     Cardiac Enzymes:  Recent Labs Lab 06/05/14 1949 06/06/14 0500  CKTOTAL 122  --   TROPONINI <0.03 <0.03    BNP: Invalid input(s): POCBNP  CBG: No results for input(s): GLUCAP in the last 168 hours.  Microbiology: No results found for this or any previous visit.  Coagulation Studies: No results for input(s): LABPROT, INR in the last 72 hours.  Urinalysis:  Recent Labs  06/06/14 0130  COLORURINE YELLOW*  LABSPEC 1.011  PHURINE 6.0  GLUCOSEU NEGATIVE  HGBUR NEGATIVE  BILIRUBINUR NEGATIVE  KETONESUR NEGATIVE  PROTEINUR NEGATIVE  NITRITE NEGATIVE  LEUKOCYTESUR NEGATIVE      Imaging: Ct Head Wo Contrast  06/05/2014   CLINICAL DATA:  Altered mental status.  EXAM: CT HEAD WITHOUT CONTRAST  TECHNIQUE: Contiguous axial images were obtained from the base of the skull through the vertex without intravenous contrast.  COMPARISON:  None.  FINDINGS: Images are somewhat limited due to persistent patient motion artifact. Bony calvarium appears grossly intact. No mass effect or midline shift is noted. Ventricular size is within normal limits. There is no evidence of mass lesion, hemorrhage or acute infarction.  IMPRESSION: No gross intracranial abnormality seen.   Electronically Signed   By: Marijo Conception, M.D.   On: 06/05/2014 20:34   Dg Chest Portable 1 View  06/05/2014   CLINICAL DATA:  Altered mental status.  Hypertension.  EXAM: PORTABLE CHEST - 1 VIEW  COMPARISON:  None.  FINDINGS: The heart size and mediastinal contours are within normal limits. Both lungs are clear. Posterior spinal fixation rods noted in the lower thoracic spine.  IMPRESSION: No active disease.   Electronically Signed   By: Earle Gell M.D.   On: 06/05/2014 20:12      Assessment & Plan: Pt is a 54 y.o. yo male with a PMHX of HTN, was admitted to [facility] on 06/05/2014 with altered mental status.   1. Hyponatremia. Unclear cause but likely SIADH vs HCTZ effect. Patient's Na level has not responded to iv  fluids.  TSH is normal CXR is neg for any lung pathology Altered mental status has also resolved  Plan for 1 dose of vaprisol

## 2014-06-07 LAB — CHLORIDE, URINE, RANDOM: Chloride Urine: 38 mmol/L

## 2014-06-07 LAB — BASIC METABOLIC PANEL
Anion gap: 5 (ref 5–15)
BUN: 9 mg/dL (ref 6–20)
CHLORIDE: 98 mmol/L — AB (ref 101–111)
CO2: 22 mmol/L (ref 22–32)
CREATININE: 0.74 mg/dL (ref 0.61–1.24)
Calcium: 8.2 mg/dL — ABNORMAL LOW (ref 8.9–10.3)
GFR calc non Af Amer: >60 mL/min (ref >60)
Glucose, Bld: 87 mg/dL (ref 65–99)
POTASSIUM: 3.8 mmol/L (ref 3.5–5.1)
Sodium: 125 mmol/L — ABNORMAL LOW (ref 135–145)

## 2014-06-07 LAB — OSMOLALITY, URINE: OSMOLALITY UR: 156 mosm/kg — AB (ref 300–900)

## 2014-06-07 NOTE — Outcomes Assessment (Signed)
VSS, patient is A+O with no signs of distress. Denies pain at this time. Appears to have slept well.  SR on tele in the 80's.  Voids without difficulty. Up to BR per self.

## 2014-06-07 NOTE — Progress Notes (Signed)
Patient was educated about follow-up appointment. Reviewed discharge instructions. Patient left unit in wheelchair with volunteer.  Arber Wiemers, RN

## 2014-06-07 NOTE — Discharge Summary (Signed)
[place] [facility] [place] NAME: Steven Davis    MR#:  [id number]  DATE OF BIRTH:  03-29-1960  DATE OF ADMISSION:  06/05/2014 ADMITTING PHYSICIAN: Max Sane, MD  DATE OF DISCHARGE: 06/07/2014  PRIMARY CARE PHYSICIAN: [facility]    ADMISSION DIAGNOSIS:  Altered mental status, unspecified altered mental status type [R41.82]  DISCHARGE DIAGNOSIS:  1. Acute encephalopathy likely multifactorial, possibly secondary to hyponatremia.  2. Acute on chronic Hyponatremia  SECONDARY DIAGNOSIS:   Past Medical History  Diagnosis Date  . Hypertension   . GERD (gastroesophageal reflux disease)     HOSPITAL COURSE:   54 year old male being admitted for acute metabolic encephalopathy likely due to severe hyponatremia. see Dr. Reddy's dictated history and physical for further details.  1.   Acute metabolic encephalopathy likely multifactorial, possibly secondary to hyponatremia. Currently patient is alert awake and oriented 3. 2.   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 3. Hypertension, controlled on home medications.  4. Hyperlipidemia, stable on statin. 5. Gastroesophageal reflux disease, stable on PPI. 6. Tobacco usage, continuous patient not motivated to quit. He was counseled for 3 minutes not ready to quit. Denies any need for nicotine replacement therapy  He is feeling much better. Sodium is close to his baseline. is being discharged home in stable condition. Agreeable with  Discharge plans.   CONSULTS OBTAINED:  Treatment Team:  Murlean Iba, MD  DRUG ALLERGIES:  No Known Allergies  DISCHARGE MEDICATIONS:   Current Discharge Medication List    CONTINUE these medications which have NOT CHANGED   Details  albuterol (PROVENTIL HFA;VENTOLIN HFA) 108 (90 BASE) MCG/ACT inhaler Inhale 2 puffs into the lungs every 6 (six) hours as needed for wheezing or shortness of breath.     amLODipine (NORVASC) 5 MG tablet Take 5 mg by mouth daily.    atorvastatin (LIPITOR) 20 MG tablet Take 20 mg by mouth at bedtime.    cyclobenzaprine (FLEXERIL) 10 MG tablet Take 10 mg by mouth 4 (four) times daily.    lisinopril-hydrochlorothiazide (PRINZIDE,ZESTORETIC) 20-12.5 MG per tablet Take 2 tablets by mouth daily.    omeprazole (PRILOSEC) 40 MG capsule Take 40 mg by mouth daily.    tadalafil (CIALIS) 10 MG tablet Take 10 mg by mouth daily as needed for erectile dysfunction.         DISCHARGE INSTRUCTIONS:     DIET:  Regular diet  DISCHARGE CONDITION:  Good  ACTIVITY:  Activity as tolerated  OXYGEN:  Home Oxygen: No.   Oxygen Delivery: room air  DISCHARGE LOCATION:  home   If you experience worsening of your admission symptoms, develop shortness of breath, life threatening emergency, suicidal or homicidal thoughts you must seek medical attention immediately by calling 911 or calling your MD immediately  if symptoms less severe.  You Must read complete instructions/literature along with all the possible adverse reactions/side effects for all the Medicines you take and that have been prescribed to you. Take any new Medicines after you have completely understood and accpet all the possible adverse reactions/side effects.   Please note  You were cared for by a hospitalist during your hospital stay. If you have any questions about your discharge medications or the care you received while you were in the hospital after you are discharged, you can call the unit and asked to speak with the hospitalist on call if the hospitalist that took care of you is not available.  Once you are discharged, your primary care physician will handle any further medical issues. Please note that NO REFILLS for any discharge medications will be authorized once you are discharged, as it is imperative that you return to your primary care physician (or establish a relationship with a primary care  physician if you do not have one) for your aftercare needs so that they can reassess your need for medications and monitor your lab values.     On the date of discharge:   VITAL SIGNS:  Blood pressure 151/81, pulse 74, temperature 97.6 F (36.4 C), temperature source Oral, resp. rate 18, weight 69.48 kg (153 lb 2.8 oz), SpO2 98 %.  I/O:   Intake/Output Summary (Last 24 hours) at 06/07/14 1011 Last data filed at 06/07/14 0400  Gross per 24 hour  Intake   3250 ml  Output   2355 ml  Net    895 ml    PHYSICAL EXAMINATION:  GENERAL:  54 y.o.-year-old patient lying in the bed with no acute distress.  EYES: Pupils equal, round, reactive to light and accommodation. No scleral icterus. Extraocular muscles intact.  HEENT: Head atraumatic, normocephalic. Oropharynx and nasopharynx clear.  NECK:  Supple, no jugular venous distention. No thyroid enlargement, no tenderness.  LUNGS: Normal breath sounds bilaterally, no wheezing, rales,rhonchi or crepitation. No use of accessory muscles of respiration.  CARDIOVASCULAR: S1, S2 normal. No murmurs, rubs, or gallops.  ABDOMEN: Soft, non-tender, non-distended. Bowel sounds present. No organomegaly or mass.  EXTREMITIES: No pedal edema, cyanosis, or clubbing.  NEUROLOGIC: Cranial nerves II through XII are intact. Muscle strength 5/5 in all extremities. Sensation intact. Gait not checked.  PSYCHIATRIC: The patient is alert and oriented x 3.  SKIN: No obvious rash, lesion, or ulcer.   DATA REVIEW:   CBC  Recent Labs Lab 06/05/14 1949  WBC 7.4  HGB 13.2  HCT 37.2*  PLT 307    Chemistries   Recent Labs Lab 06/06/14 0500  06/07/14 0546  NA 119*  < > 125*  K 3.5  < > 3.8  CL 85*  < > 98*  CO2 26  < > 22  GLUCOSE 92  < > 87  BUN 13  < > 9  CREATININE 0.86  < > 0.74  CALCIUM 8.5*  < > 8.2*  AST 20  --   --   ALT 11*  --   --   ALKPHOS 104  --   --   BILITOT 0.8  --   --   < > = values in this interval not  displayed.  Microbiology Results  No results found for this or any previous visit.  RADIOLOGY:  Ct Head Wo Contrast  06/05/2014   CLINICAL DATA:  Altered mental status.  EXAM: CT HEAD WITHOUT CONTRAST  TECHNIQUE: Contiguous axial images were obtained from the base of the skull through the vertex without intravenous contrast.  COMPARISON:  None.  FINDINGS: Images are somewhat limited due to persistent patient motion artifact. Bony calvarium appears grossly intact. No mass effect or midline shift is noted. Ventricular size is within normal limits. There is no evidence of mass lesion, hemorrhage or acute infarction.  IMPRESSION: No gross intracranial abnormality seen.   Electronically Signed   By: Marijo Conception, M.D.   On: 06/05/2014 20:34   Dg Chest Portable 1 View  06/05/2014   CLINICAL DATA:  Altered mental status.  Hypertension.  EXAM: PORTABLE CHEST - 1 VIEW  COMPARISON:  None.  FINDINGS:  The heart size and mediastinal contours are within normal limits. Both lungs are clear. Posterior spinal fixation rods noted in the lower thoracic spine.  IMPRESSION: No active disease.   Electronically Signed   By: Earle Gell M.D.   On: 06/05/2014 20:12   Management plans discussed with the patient, family and they are in agreement.  CODE STATUS:     Code Status Orders        Start     Ordered   06/06/14 0810  Full code   Continuous     06/06/14 0809      TOTAL TIME TAKING CARE OF THIS PATIENT: 35 minutes.    [facility], Mishka Stegemann M.D on 06/07/2014 at 10:11 AM  Between 7am to 6pm - Pager - [phone]  After 6pm go to [email or web address] - password [email or web address] [facility]  [facility]  Office  [phone]  CC: Primary care physician; [place] [facility]

## 2014-06-10 ENCOUNTER — Emergency Department
Admission: EM | Admit: 2014-06-10 | Discharge: 2014-06-10 | Disposition: A | Discharge status: Home / Self Care | Payer: Medicare Other | Attending: Emergency Medicine | Admitting: Emergency Medicine

## 2014-06-10 ENCOUNTER — Other Ambulatory Visit: Payer: Self-pay

## 2014-06-10 ENCOUNTER — Encounter: Payer: Self-pay | Admitting: Emergency Medicine

## 2014-06-10 DIAGNOSIS — Z79899 Other long term (current) drug therapy: Secondary | ICD-10-CM | POA: Diagnosis not present

## 2014-06-10 DIAGNOSIS — E871 Hypo-osmolality and hyponatremia: Secondary | ICD-10-CM | POA: Insufficient documentation

## 2014-06-10 DIAGNOSIS — R4182 Altered mental status, unspecified: Secondary | ICD-10-CM | POA: Diagnosis present

## 2014-06-10 DIAGNOSIS — I10 Essential (primary) hypertension: Secondary | ICD-10-CM | POA: Insufficient documentation

## 2014-06-10 DIAGNOSIS — Z720 Tobacco use: Secondary | ICD-10-CM | POA: Diagnosis not present

## 2014-06-10 HISTORY — DX: Male erectile dysfunction, unspecified: N52.9

## 2014-06-10 LAB — CBC
HCT: 35.8 % — ABNORMAL LOW (ref 40.0–52.0)
Hemoglobin: 12.1 g/dL — ABNORMAL LOW (ref 13.0–18.0)
MCH: 28.5 pg (ref 26.0–34.0)
MCHC: 33.8 g/dL (ref 32.0–36.0)
MCV: 84.4 fL (ref 80.0–100.0)
PLATELETS: 349 10*3/uL (ref 150–440)
RBC: 4.24 MIL/uL — AB (ref 4.40–5.90)
RDW: 16.2 % — AB (ref 11.5–14.5)
WBC: 9.1 10*3/uL (ref 3.8–10.6)

## 2014-06-10 LAB — URINALYSIS COMPLETE WITH MICROSCOPIC (ARMC ONLY)
BILIRUBIN URINE: NEGATIVE
Bacteria, UA: NONE SEEN
Glucose, UA: NEGATIVE mg/dL
Hgb urine dipstick: NEGATIVE
LEUKOCYTES UA: NEGATIVE
Nitrite: NEGATIVE
PH: 6 (ref 5.0–8.0)
PROTEIN: NEGATIVE mg/dL
RBC / HPF: NONE SEEN RBC/hpf (ref 0–5)
Specific Gravity, Urine: 1.006 (ref 1.005–1.030)
Squamous Epithelial / LPF: NONE SEEN

## 2014-06-10 LAB — COMPREHENSIVE METABOLIC PANEL
ALK PHOS: 95 U/L (ref 38–126)
ALT: 10 U/L — ABNORMAL LOW (ref 17–63)
ANION GAP: 11 (ref 5–15)
AST: 21 U/L (ref 15–41)
Albumin: 4.1 g/dL (ref 3.5–5.0)
BILIRUBIN TOTAL: 0.7 mg/dL (ref 0.3–1.2)
BUN: 15 mg/dL (ref 6–20)
CO2: 22 mmol/L (ref 22–32)
Calcium: 8.6 mg/dL — ABNORMAL LOW (ref 8.9–10.3)
Chloride: 88 mmol/L — ABNORMAL LOW (ref 101–111)
Creatinine, Ser: 1 mg/dL (ref 0.61–1.24)
GFR calc non Af Amer: >60 mL/min (ref >60)
Glucose, Bld: 91 mg/dL (ref 65–99)
Potassium: 3.8 mmol/L (ref 3.5–5.1)
Sodium: 121 mmol/L — ABNORMAL LOW (ref 135–145)
Total Protein: 7.6 g/dL (ref 6.5–8.1)

## 2014-06-10 LAB — MAGNESIUM: MAGNESIUM: 1.6 mg/dL — AB (ref 1.7–2.4)

## 2014-06-10 MED ORDER — SODIUM CHLORIDE 0.9 % IV BOLUS (SEPSIS)
1000.0000 mL | Freq: Once | INTRAVENOUS | Status: AC
  Administered 2014-06-10: 1000 mL via INTRAVENOUS
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
## 2014-06-10 NOTE — Discharge Instructions (Signed)
Please seek medical attention for any high fevers, chest pain, shortness of breath, change in behavior, persistent vomiting, bloody stool or any other new or concerning symptoms.  Hyponatremia  Hyponatremia is when the amount of salt (sodium) in your blood is too low. When sodium levels are low, your cells will absorb extra water and swell. The swelling happens throughout the body, but it mostly affects the brain. Severe brain swelling (cerebral edema), seizures, or coma can happen.  CAUSES   Heart, kidney, or liver problems.  Thyroid problems.  Adrenal gland problems.  Severe vomiting and diarrhea.  Certain medicines or illegal drugs.  Dehydration.  Drinking too much water.  Low-sodium diet. SYMPTOMS   Nausea and vomiting.  Confusion.  Lethargy.  Agitation.  Headache.  Twitching or shaking (seizures).  Unconsciousness.  Appetite loss.  Muscle weakness and cramping. DIAGNOSIS  Hyponatremia is identified by a simple blood test. Your caregiver will perform a history and physical exam to try to find the cause and type of hyponatremia. Other tests may be needed to measure the amount of sodium in your blood and urine. TREATMENT  Treatment will depend on the cause.   Fluids may be given through the vein (IV).  Medicines may be used to correct the sodium imbalance. If medicines are causing the problem, they will need to be adjusted.  Water or fluid intake may be restricted to restore proper balance. The speed of correcting the sodium problem is very important. If the problem is corrected too fast, nerve damage (sometimes unchangeable) can happen. HOME CARE INSTRUCTIONS   Only take medicines as directed by your caregiver. Many medicines can make hyponatremia worse. Discuss all your medicines with your caregiver.  Carefully follow any recommended diet, including any fluid restrictions.  You may be asked to repeat lab tests. Follow these directions.  Avoid alcohol and  recreational drugs. SEEK MEDICAL CARE IF:   You develop worsening nausea, fatigue, headache, confusion, or weakness.  Your original hyponatremia symptoms return.  You have problems following the recommended diet. SEEK IMMEDIATE MEDICAL CARE IF:   You have a seizure.  You faint.  You have ongoing diarrhea or vomiting. MAKE SURE YOU:   Understand these instructions.  Will watch your condition.  Will get help right away if you are not doing well or get worse. Document Released: 01/01/2002 Document Revised: 04/05/2011 Document Reviewed: 06/28/2010 [facility] Patient Information 2015 [place], [place]. This information is not intended to replace advice given to you by your health care provider. Make sure you discuss any questions you have with your health care provider.

## 2014-06-10 NOTE — ED Notes (Signed)
NS started on patient. Resting comfortably on bed. Call bell at his side. Denies any further needs or concerns at this time.

## 2014-06-12 ENCOUNTER — Emergency Department
Admission: EM | Admit: 2014-06-12 | Discharge: 2014-06-12 | Disposition: A | Discharge status: Home / Self Care | Payer: Medicare Other | Attending: Emergency Medicine | Admitting: Emergency Medicine

## 2014-06-12 ENCOUNTER — Encounter: Payer: Self-pay | Admitting: Emergency Medicine

## 2014-06-12 DIAGNOSIS — Z79899 Other long term (current) drug therapy: Secondary | ICD-10-CM | POA: Diagnosis not present

## 2014-06-12 DIAGNOSIS — L989 Disorder of the skin and subcutaneous tissue, unspecified: Secondary | ICD-10-CM | POA: Insufficient documentation

## 2014-06-12 DIAGNOSIS — H538 Other visual disturbances: Secondary | ICD-10-CM

## 2014-06-12 DIAGNOSIS — E871 Hypo-osmolality and hyponatremia: Secondary | ICD-10-CM | POA: Diagnosis not present

## 2014-06-12 DIAGNOSIS — I10 Essential (primary) hypertension: Secondary | ICD-10-CM | POA: Insufficient documentation

## 2014-06-12 DIAGNOSIS — H547 Unspecified visual loss: Secondary | ICD-10-CM | POA: Diagnosis present

## 2014-06-12 DIAGNOSIS — Z720 Tobacco use: Secondary | ICD-10-CM | POA: Insufficient documentation

## 2014-06-12 LAB — CBC WITH DIFFERENTIAL/PLATELET
Basophils Absolute: 0.1 10*3/uL (ref 0–0.1)
Basophils Relative: 1 %
EOS PCT: 3 %
Eosinophils Absolute: 0.2 10*3/uL (ref 0–0.7)
HEMATOCRIT: 37.5 % — AB (ref 40.0–52.0)
Hemoglobin: 12.7 g/dL — ABNORMAL LOW (ref 13.0–18.0)
LYMPHS ABS: 1.3 10*3/uL (ref 1.0–3.6)
Lymphocytes Relative: 16 %
MCH: 28.7 pg (ref 26.0–34.0)
MCHC: 33.8 g/dL (ref 32.0–36.0)
MCV: 84.7 fL (ref 80.0–100.0)
MONO ABS: 0.6 10*3/uL (ref 0.2–1.0)
Monocytes Relative: 8 %
Neutro Abs: 5.7 10*3/uL (ref 1.4–6.5)
Neutrophils Relative %: 72 %
Platelets: 371 10*3/uL (ref 150–440)
RBC: 4.44 MIL/uL (ref 4.40–5.90)
RDW: 16.8 % — ABNORMAL HIGH (ref 11.5–14.5)
WBC: 7.9 10*3/uL (ref 3.8–10.6)

## 2014-06-12 LAB — COMPREHENSIVE METABOLIC PANEL
ALBUMIN: 4.7 g/dL (ref 3.5–5.0)
ALT: 10 U/L — AB (ref 17–63)
AST: 20 U/L (ref 15–41)
Alkaline Phosphatase: 99 U/L (ref 38–126)
Anion gap: 11 (ref 5–15)
BILIRUBIN TOTAL: 0.5 mg/dL (ref 0.3–1.2)
BUN: 19 mg/dL (ref 6–20)
CHLORIDE: 90 mmol/L — AB (ref 101–111)
CO2: 24 mmol/L (ref 22–32)
CREATININE: 0.94 mg/dL (ref 0.61–1.24)
Calcium: 9.4 mg/dL (ref 8.9–10.3)
GFR calc Af Amer: >60 mL/min (ref >60)
GFR calc non Af Amer: >60 mL/min (ref >60)
Glucose, Bld: 102 mg/dL — ABNORMAL HIGH (ref 65–99)
POTASSIUM: 3.7 mmol/L (ref 3.5–5.1)
SODIUM: 125 mmol/L — AB (ref 135–145)
Total Protein: 8.5 g/dL — ABNORMAL HIGH (ref 6.5–8.1)

## 2014-06-12 LAB — ETHANOL

## 2014-06-12 MED ORDER — DOXYCYCLINE HYCLATE 100 MG PO TABS: 100.0000 mg | ORAL_TABLET | Freq: Two times a day (BID) | ORAL | Status: DC

## 2014-06-12 MED ORDER — LISINOPRIL 20 MG PO TABS: 20.0000 mg | ORAL_TABLET | Freq: Every day | ORAL | Status: DC

## 2014-06-12 MED ORDER — DOXYCYCLINE HYCLATE 100 MG PO TABS
100.0000 mg | ORAL_TABLET | Freq: Once | ORAL | Status: AC
  Administered 2014-06-12: 100 mg via ORAL
  Filled 2014-06-12: qty 1

## 2014-06-12 MED ORDER — SODIUM CHLORIDE 0.9 % IV BOLUS (SEPSIS)
1000.0000 mL | Freq: Once | INTRAVENOUS | Status: AC
  Administered 2014-06-12: 1000 mL via INTRAVENOUS

## 2014-06-12 NOTE — ED Notes (Signed)
Pt discharged home after verbalizing understanding of discharge instructions; nad noted. 

## 2014-06-12 NOTE — Discharge Instructions (Signed)
Blurred Vision Stop taking your lisinopril and HCTZ combination pill. Begin taking the lisinopril only pill prescribed to today. You have been seen today complaining of blurred vision. This means you have a loss of ability to see small details.  CAUSES  Blurred vision can be a symptom of underlying eye problems, such as:  Aging of the eye (presbyopia).  Glaucoma.  Cataracts.  Eye infection.  Eye-related migraine.  Diabetes mellitus.  Fatigue.  Migraine headaches.  High blood pressure.  Breakdown of the back of the eye (macular degeneration).  Problems caused by some medications. The most common cause of blurred vision is the need for eyeglasses or a new prescription. Today in the emergency department, no cause for your blurred vision can be found. SYMPTOMS  Blurred vision is the loss of visual sharpness and detail (acuity). DIAGNOSIS  Should blurred vision continue, you should see your caregiver. If your caregiver is your primary care physician, he or she may choose to refer you to another specialist.  TREATMENT  Do not ignore your blurred vision. Make sure to have it checked out to see if further treatment or referral is necessary. SEEK MEDICAL CARE IF:  You are unable to get into a specialist so we can help you with a referral. SEEK IMMEDIATE MEDICAL CARE IF: You have severe eye pain, severe headache, or sudden loss of vision. MAKE SURE YOU:   Understand these instructions.  Will watch your condition.  Will get help right away if you are not doing well or get worse. Document Released: 01/14/2003 Document Revised: 04/05/2011 Document Reviewed: 08/16/2007 [facility] Patient Information 2015 [place], [place]. This information is not intended to replace advice given to you by your health care provider. Make sure you discuss any questions you have with your health care provider.  Hyponatremia  Hyponatremia is when the amount of salt (sodium) in your blood is too low. When  sodium levels are low, your cells will absorb extra water and swell. The swelling happens throughout the body, but it mostly affects the brain. Severe brain swelling (cerebral edema), seizures, or coma can happen.  CAUSES   Heart, kidney, or liver problems.  Thyroid problems.  Adrenal gland problems.  Severe vomiting and diarrhea.  Certain medicines or illegal drugs.  Dehydration.  Drinking too much water.  Low-sodium diet. SYMPTOMS   Nausea and vomiting.  Confusion.  Lethargy.  Agitation.  Headache.  Twitching or shaking (seizures).  Unconsciousness.  Appetite loss.  Muscle weakness and cramping. DIAGNOSIS  Hyponatremia is identified by a simple blood test. Your caregiver will perform a history and physical exam to try to find the cause and type of hyponatremia. Other tests may be needed to measure the amount of sodium in your blood and urine. TREATMENT  Treatment will depend on the cause.   Fluids may be given through the vein (IV).  Medicines may be used to correct the sodium imbalance. If medicines are causing the problem, they will need to be adjusted.  Water or fluid intake may be restricted to restore proper balance. The speed of correcting the sodium problem is very important. If the problem is corrected too fast, nerve damage (sometimes unchangeable) can happen. HOME CARE INSTRUCTIONS   Only take medicines as directed by your caregiver. Many medicines can make hyponatremia worse. Discuss all your medicines with your caregiver.  Carefully follow any recommended diet, including any fluid restrictions.  You may be asked to repeat lab tests. Follow these directions.  Avoid alcohol and recreational drugs.  SEEK MEDICAL CARE IF:   You develop worsening nausea, fatigue, headache, confusion, or weakness.  Your original hyponatremia symptoms return.  You have problems following the recommended diet. SEEK IMMEDIATE MEDICAL CARE IF:   You have a  seizure.  You faint.  You have ongoing diarrhea or vomiting. MAKE SURE YOU:   Understand these instructions.  Will watch your condition.  Will get help right away if you are not doing well or get worse. Document Released: 01/01/2002 Document Revised: 04/05/2011 Document Reviewed: 06/28/2010 [facility] Patient Information 2015 [place], [place]. This information is not intended to replace advice given to you by your health care provider. Make sure you discuss any questions you have with your health care provider.

## 2014-06-12 NOTE — ED Provider Notes (Addendum)
[facility] Emergency Department Provider Note  ____________________________________________  Time seen: Approximately 3:08 PM  I have reviewed the triage vital signs and the nursing notes.   HISTORY  Chief Complaint Loss of Vision    HPI Steven Davis is a 54 y.o. male with a history of chronically low sodium on and HCTZ containing antihypertensive medicine who presents today with vision loss. He says the vision loss started [place] morning and has been bilateral with blurred vision at distances. Up close he can see clearly. He says that he has had 2 episodes of this in the past week and has been seen in the emergency department and admitted once.  He says that his symptoms have resolved with normal saline. He denies any drinking only says that he used to be an alcoholic until January of this year. He does say that he had a tick bite to his right flank 2 weeks ago when the tick was removed by family member. He says that the entire tick was removed as far as he knew without any retained parts.He says that he did have a rash initially but does not describe a target lesion.   Past Medical History  Diagnosis Date  . Hypertension   . GERD (gastroesophageal reflux disease)   . Erectile dysfunction     Patient Active Problem List   Diagnosis Date Noted  . Acute encephalopathy 06/06/2014  . Hyponatremia 06/06/2014  . HTN (hypertension) 06/06/2014  . GERD (gastroesophageal reflux disease) 06/06/2014    Past Surgical History  Procedure Laterality Date  . Back surgery      Current Outpatient Rx  Name  Route  Sig  Dispense  Refill  . albuterol (PROVENTIL HFA;VENTOLIN HFA) 108 (90 BASE) MCG/ACT inhaler   Inhalation   Inhale 2 puffs into the lungs every 6 (six) hours as needed for wheezing or shortness of breath.         Marland Kitchen amLODipine (NORVASC) 5 MG tablet   Oral   Take 5 mg by mouth daily.         Marland Kitchen atorvastatin (LIPITOR) 20 MG tablet   Oral   Take 20 mg  by mouth at bedtime.         Marland Kitchen lisinopril-hydrochlorothiazide (PRINZIDE,ZESTORETIC) 20-12.5 MG per tablet   Oral   Take 2 tablets by mouth daily.         Marland Kitchen omeprazole (PRILOSEC) 40 MG capsule   Oral   Take 40 mg by mouth daily.         . cyclobenzaprine (FLEXERIL) 10 MG tablet   Oral   Take 10 mg by mouth 4 (four) times daily.         . tadalafil (CIALIS) 10 MG tablet   Oral   Take 10 mg by mouth daily as needed for erectile dysfunction.           Allergies Benadryl  Family History  Problem Relation Age of Onset  . Hypertension Father     Social History History  Substance Use Topics  . Smoking status: Current Every Day Smoker -- 1.00 packs/day    Types: Cigarettes  . Smokeless tobacco: Never Used  . Alcohol Use: No    Review of Systems Constitutional: No fever/chills Eyes: Blurred vision bilaterally  ENT: No sore throat. Cardiovascular: Denies chest pain. Respiratory: Denies shortness of breath. Gastrointestinal: No abdominal pain.  No nausea, no vomiting.  No diarrhea.  No constipation. Genitourinary: Negative for dysuria. Musculoskeletal: Negative for back pain. Skin: Scabbed  over area where tick was removed. Initially had rash but now resolved. Neurological: Negative for headaches, focal weakness or numbness.  10-point ROS otherwise negative.  ____________________________________________   PHYSICAL EXAM:  VITAL SIGNS: ED Triage Vitals  Enc Vitals Group     BP --      Pulse Rate 06/12/14 1417 72     Resp 06/12/14 1417 18     Temp 06/12/14 1417 98.1 F (36.7 C)     Temp Source 06/12/14 1417 Oral     SpO2 06/12/14 1413 97 %     Weight 06/12/14 1417 154 lb (69.854 kg)     Height 06/12/14 1417 '5\' 1"'$  (1.549 m)     Head Cir --      Peak Flow --      Pain Score 06/12/14 1419 0     Pain Loc --      Pain Edu? --      Excl. in [place]? --     Constitutional: Alert and oriented. Well appearing and in no acute distress. Eyes: Conjunctivae are  normal. PERRL. EOMI. 20/400 vision to the right eye uncorrected.  20/200 vision to the left eye. Head: Atraumatic. Nose: No congestion/rhinnorhea. Mouth/Throat: Mucous membranes are moist.  Oropharynx non-erythematous. Neck: No stridor.   Cardiovascular: Normal rate, regular rhythm. Grossly normal heart sounds.  Good peripheral circulation. Respiratory: Normal respiratory effort.  No retractions. Lungs CTAB. Gastrointestinal: Soft and nontender. No distention. No abdominal bruits. No CVA tenderness. Musculoskeletal: No lower extremity tenderness nor edema.  No joint effusions. Neurologic:  Normal speech and language. No gross focal neurologic deficits are appreciated except for vision loss noted above. Speech is normal. No gait instability. Skin:  Right flank with 2 mm area scabbed lesion. There is no target lesion. There is some mild surrounding erythema.  Psychiatric: Mood and affect are normal. Speech and behavior are normal.  ____________________________________________   LABS (all labs ordered are listed, but only abnormal results are displayed)  Labs Reviewed  CBC WITH DIFFERENTIAL/PLATELET - Abnormal; Notable for the following:    Hemoglobin 12.7 (*)    HCT 37.5 (*)    RDW 16.8 (*)    All other components within normal limits  COMPREHENSIVE METABOLIC PANEL - Abnormal; Notable for the following:    Sodium 125 (*)    Chloride 90 (*)    Glucose, Bld 102 (*)    Total Protein 8.5 (*)    ALT 10 (*)    All other components within normal limits  ETHANOL  ROCKY MTN SPOTTED FVR ABS PNL(IGG+IGM)  B. BURGDORFI ANTIBODIES   ____________________________________________  EKG   ____________________________________________  RADIOLOGY  ____________________________________________   PROCEDURES    ____________________________________________   INITIAL IMPRESSION / ASSESSMENT AND PLAN / ED COURSE  Pertinent labs & imaging results that were available during my care of the  patient were reviewed by me and considered in my medical decision making (see chart for details).  ----------------------------------------- 6:34 PM on 06/12/2014 -----------------------------------------  Patient says his vision is 120% after IV normal saline. While his sodium level is low it is reassuring considering his chronic sodium levels. I counseled the patient to stop taking his lisinopril HCTZ combination pill in the understands the plan. I will give him a separate prescription for lisinopril 20 mg daily. I will also give him a prescription for doxycycline given possible Lyme disease. He will follow-up with the [facility] clinic. ____________________________________________   FINAL CLINICAL IMPRESSION(S) / ED DIAGNOSES  Bilateral blurry vision now resolved. Hyponatremia  that is chronic. Empiric treatment for Lyme disease. Acute, subsequent visit.     Orbie Pyo, MD 06/12/14 1836  We'll treat empirically for Lyme disease. Patient says symptoms have started since being bitten by a tick.  Orbie Pyo, MD 06/12/14 [phone]

## 2014-06-12 NOTE — ED Notes (Signed)
Pt reports that he was here on Monday for the same. He reports that his vision is blurred, "it isnt as bad as it was on Monday." " they gave me Sodium and I was better, I have a history of this."

## 2014-06-12 NOTE — ED Notes (Signed)
Pt states he is seeing better. He states he is at 65% of normal when he is questioned regarding extent of sight.

## 2014-06-13 LAB — B. BURGDORFI ANTIBODIES

## 2014-06-13 LAB — ROCKY MTN SPOTTED FVR ABS PNL(IGG+IGM)
RMSF IGM: 0.39 {index} (ref 0.00–0.89)
RMSF IgG: NEGATIVE

## 2014-11-21 ENCOUNTER — Encounter: Payer: Self-pay | Admitting: Internal Medicine

## 2014-11-21 ENCOUNTER — Emergency Department: Payer: Medicare Other

## 2014-11-21 ENCOUNTER — Inpatient Hospital Stay
Admission: EM | Admit: 2014-11-21 | Discharge: 2014-11-23 | DRG: 812 | Disposition: A | Discharge status: Home / Self Care | Payer: Medicare Other | Attending: Internal Medicine | Admitting: Internal Medicine

## 2014-11-21 DIAGNOSIS — N529 Male erectile dysfunction, unspecified: Secondary | ICD-10-CM | POA: Diagnosis present

## 2014-11-21 DIAGNOSIS — Z801 Family history of malignant neoplasm of trachea, bronchus and lung: Secondary | ICD-10-CM | POA: Diagnosis not present

## 2014-11-21 DIAGNOSIS — D509 Iron deficiency anemia, unspecified: Secondary | ICD-10-CM | POA: Diagnosis present

## 2014-11-21 DIAGNOSIS — I110 Hypertensive heart disease with heart failure: Secondary | ICD-10-CM | POA: Diagnosis present

## 2014-11-21 DIAGNOSIS — J449 Chronic obstructive pulmonary disease, unspecified: Secondary | ICD-10-CM | POA: Diagnosis present

## 2014-11-21 DIAGNOSIS — K222 Esophageal obstruction: Secondary | ICD-10-CM | POA: Diagnosis present

## 2014-11-21 DIAGNOSIS — F17210 Nicotine dependence, cigarettes, uncomplicated: Secondary | ICD-10-CM | POA: Diagnosis present

## 2014-11-21 DIAGNOSIS — Z8711 Personal history of peptic ulcer disease: Secondary | ICD-10-CM

## 2014-11-21 DIAGNOSIS — I509 Heart failure, unspecified: Secondary | ICD-10-CM | POA: Diagnosis present

## 2014-11-21 DIAGNOSIS — E871 Hypo-osmolality and hyponatremia: Secondary | ICD-10-CM | POA: Diagnosis present

## 2014-11-21 DIAGNOSIS — Z8249 Family history of ischemic heart disease and other diseases of the circulatory system: Secondary | ICD-10-CM

## 2014-11-21 DIAGNOSIS — G8929 Other chronic pain: Secondary | ICD-10-CM | POA: Diagnosis present

## 2014-11-21 DIAGNOSIS — F1020 Alcohol dependence, uncomplicated: Secondary | ICD-10-CM | POA: Diagnosis present

## 2014-11-21 DIAGNOSIS — K921 Melena: Secondary | ICD-10-CM | POA: Diagnosis present

## 2014-11-21 DIAGNOSIS — K210 Gastro-esophageal reflux disease with esophagitis: Secondary | ICD-10-CM | POA: Diagnosis present

## 2014-11-21 DIAGNOSIS — Z79899 Other long term (current) drug therapy: Secondary | ICD-10-CM

## 2014-11-21 DIAGNOSIS — D649 Anemia, unspecified: Secondary | ICD-10-CM | POA: Diagnosis present

## 2014-11-21 DIAGNOSIS — Z7982 Long term (current) use of aspirin: Secondary | ICD-10-CM

## 2014-11-21 DIAGNOSIS — M549 Dorsalgia, unspecified: Secondary | ICD-10-CM | POA: Diagnosis present

## 2014-11-21 DIAGNOSIS — K449 Diaphragmatic hernia without obstruction or gangrene: Secondary | ICD-10-CM | POA: Diagnosis present

## 2014-11-21 DIAGNOSIS — R0600 Dyspnea, unspecified: Secondary | ICD-10-CM

## 2014-11-21 LAB — CBC WITH DIFFERENTIAL/PLATELET
BAND NEUTROPHILS: 0 %
Basophils Absolute: 0.2 10*3/uL — ABNORMAL HIGH (ref 0–0.1)
Basophils Relative: 3 %
Blasts: 0 %
EOS ABS: 0.2 10*3/uL (ref 0–0.7)
Eosinophils Relative: 3 %
HCT: 14 % — CL (ref 40.0–52.0)
Hemoglobin: 3.9 g/dL — CL (ref 13.0–18.0)
LYMPHS PCT: 13 %
Lymphs Abs: 0.9 10*3/uL — ABNORMAL LOW (ref 1.0–3.6)
MCH: 15.5 pg — ABNORMAL LOW (ref 26.0–34.0)
MCHC: 28 g/dL — ABNORMAL LOW (ref 32.0–36.0)
MCV: 55.2 fL — ABNORMAL LOW (ref 80.0–100.0)
Metamyelocytes Relative: 0 %
Monocytes Absolute: 0.5 10*3/uL (ref 0.2–1.0)
Monocytes Relative: 7 %
Myelocytes: 0 %
NEUTROS PCT: 74 %
NRBC: 6 /100{WBCs} — AB
Neutro Abs: 5.1 10*3/uL (ref 1.4–6.5)
OTHER: 0 %
PLATELETS: 488 10*3/uL — AB (ref 150–440)
PROMYELOCYTES ABS: 0 %
RBC: 2.54 MIL/uL — ABNORMAL LOW (ref 4.40–5.90)
RDW: 21.3 % — AB (ref 11.5–14.5)
WBC: 6.9 10*3/uL (ref 3.8–10.6)

## 2014-11-21 LAB — IRON AND TIBC
IRON: 6 ug/dL — AB (ref 45–182)
Saturation Ratios: 1 % — ABNORMAL LOW (ref 17.9–39.5)
TIBC: 550 ug/dL — ABNORMAL HIGH (ref 250–450)
UIBC: 544 ug/dL

## 2014-11-21 LAB — BASIC METABOLIC PANEL
ANION GAP: 10 (ref 5–15)
BUN: 10 mg/dL (ref 6–20)
CO2: 19 mmol/L — ABNORMAL LOW (ref 22–32)
Calcium: 8.7 mg/dL — ABNORMAL LOW (ref 8.9–10.3)
Chloride: 95 mmol/L — ABNORMAL LOW (ref 101–111)
Creatinine, Ser: 0.87 mg/dL (ref 0.61–1.24)
GFR calc Af Amer: >60 mL/min (ref >60)
GLUCOSE: 117 mg/dL — AB (ref 65–99)
Potassium: 4.6 mmol/L (ref 3.5–5.1)
SODIUM: 124 mmol/L — AB (ref 135–145)

## 2014-11-21 LAB — PROTIME-INR
INR: 1.32
PROTHROMBIN TIME: 16.6 s — AB (ref 11.4–15.0)

## 2014-11-21 LAB — RETICULOCYTES
RBC.: 2.53 MIL/uL — ABNORMAL LOW (ref 4.40–5.90)
RETIC COUNT ABSOLUTE: 78.4 10*3/uL (ref 19.0–183.0)
Retic Ct Pct: 3.1 % (ref 0.4–3.1)

## 2014-11-21 LAB — TROPONIN I: Troponin I: <0.03 ng/mL (ref <0.031)

## 2014-11-21 LAB — ABO/RH: ABO/RH(D): A NEG

## 2014-11-21 LAB — FOLATE: Folate: 20.1 ng/mL (ref >5.9)

## 2014-11-21 LAB — BRAIN NATRIURETIC PEPTIDE: B NATRIURETIC PEPTIDE 5: 489 pg/mL — AB (ref 0.0–100.0)

## 2014-11-21 LAB — FERRITIN: FERRITIN: 7 ng/mL — AB (ref 24–336)

## 2014-11-21 LAB — PREPARE RBC (CROSSMATCH)

## 2014-11-21 MED ORDER — SODIUM CHLORIDE 0.9 % IV SOLN
INTRAVENOUS | Status: DC
  Administered 2014-11-21 – 2014-11-22 (×2): via INTRAVENOUS

## 2014-11-21 MED ORDER — IPRATROPIUM-ALBUTEROL 0.5-2.5 (3) MG/3ML IN SOLN
3.0000 mL | Freq: Four times a day (QID) | RESPIRATORY_TRACT | Status: DC
  Administered 2014-11-21 – 2014-11-23 (×7): 3 mL via RESPIRATORY_TRACT
  Filled 2014-11-21 (×7): qty 3

## 2014-11-21 MED ORDER — SODIUM CHLORIDE 0.9 % IV SOLN
10.0000 mL/h | Freq: Once | INTRAVENOUS | Status: AC
  Administered 2014-11-21: 10 mL/h via INTRAVENOUS

## 2014-11-21 MED ORDER — ONDANSETRON HCL 4 MG PO TABS: 4.0000 mg | ORAL_TABLET | Freq: Four times a day (QID) | ORAL | Status: DC | PRN

## 2014-11-21 MED ORDER — SODIUM CHLORIDE 0.9 % IJ SOLN
3.0000 mL | Freq: Two times a day (BID) | INTRAMUSCULAR | Status: DC
  Administered 2014-11-21 – 2014-11-23 (×4): 3 mL via INTRAVENOUS

## 2014-11-21 MED ORDER — SODIUM CHLORIDE 0.9 % IJ SOLN: 3.0000 mL | INTRAMUSCULAR | Status: DC | PRN

## 2014-11-21 MED ORDER — METHYLPREDNISOLONE SODIUM SUCC 125 MG IJ SOLR
60.0000 mg | Freq: Four times a day (QID) | INTRAMUSCULAR | Status: DC
  Administered 2014-11-21 – 2014-11-22 (×4): 60 mg via INTRAVENOUS
  Filled 2014-11-21 (×4): qty 2

## 2014-11-21 MED ORDER — ONDANSETRON HCL 4 MG/2ML IJ SOLN: 4.0000 mg | Freq: Four times a day (QID) | INTRAMUSCULAR | Status: DC | PRN

## 2014-11-21 MED ORDER — ALUM & MAG HYDROXIDE-SIMETH 200-200-20 MG/5ML PO SUSP: 30.0000 mL | Freq: Four times a day (QID) | ORAL | Status: DC | PRN

## 2014-11-21 MED ORDER — IPRATROPIUM-ALBUTEROL 0.5-2.5 (3) MG/3ML IN SOLN
3.0000 mL | Freq: Once | RESPIRATORY_TRACT | Status: AC
  Administered 2014-11-21: 3 mL via RESPIRATORY_TRACT
  Filled 2014-11-21: qty 3

## 2014-11-21 MED ORDER — HYDROCODONE-ACETAMINOPHEN 5-325 MG PO TABS: 1.0000 | ORAL_TABLET | ORAL | Status: DC | PRN

## 2014-11-21 MED ORDER — ACETAMINOPHEN 325 MG PO TABS
650.0000 mg | ORAL_TABLET | Freq: Four times a day (QID) | ORAL | Status: DC | PRN
Start: 2014-11-21 — End: 2014-11-23

## 2014-11-21 MED ORDER — BISACODYL 5 MG PO TBEC: 5.0000 mg | DELAYED_RELEASE_TABLET | Freq: Every day | ORAL | Status: DC | PRN

## 2014-11-21 MED ORDER — INFLUENZA VAC SPLIT QUAD 0.5 ML IM SUSY
0.5000 mL | PREFILLED_SYRINGE | INTRAMUSCULAR | Status: DC
  Filled 2014-11-21 (×2): qty 0.5

## 2014-11-21 MED ORDER — DOCUSATE SODIUM 100 MG PO CAPS
100.0000 mg | ORAL_CAPSULE | Freq: Two times a day (BID) | ORAL | Status: DC
  Administered 2014-11-21 – 2014-11-23 (×2): 100 mg via ORAL
  Filled 2014-11-21 (×2): qty 1

## 2014-11-21 MED ORDER — PANTOPRAZOLE SODIUM 40 MG IV SOLR
40.0000 mg | Freq: Two times a day (BID) | INTRAVENOUS | Status: DC
  Administered 2014-11-21 – 2014-11-22 (×3): 40 mg via INTRAVENOUS
  Filled 2014-11-21 (×3): qty 40

## 2014-11-21 MED ORDER — SODIUM CHLORIDE 0.9 % IV SOLN
INTRAVENOUS | Status: DC
  Administered 2014-11-22: 16:00:00 via INTRAVENOUS

## 2014-11-21 MED ORDER — ACETAMINOPHEN 650 MG RE SUPP: 650.0000 mg | Freq: Four times a day (QID) | RECTAL | Status: DC | PRN

## 2014-11-21 NOTE — ED Notes (Signed)
Attempted to call report to the floor.  Nurse was unavailable to take report at this time.

## 2014-11-21 NOTE — Consult Note (Signed)
GI Inpatient Consult Note  Reason for Consult: Anemia   Attending Requesting Consult: Dr.  Manuella Ghazi  History of Present Illness: Steven Davis is a 54 y.o. male with a significant pmh of anemia requiring transfusions, alcohol abuse, chronic NSAID abuse, chronic hyponatremia, COPD (not on home O2)  reports that he has had increasing fatigue and SOB over the past week.  His SOB became worse yesterday to where it would take him 20 minutes to be able to draw a deep breath.  He had difficulty ambulating from room to room, but was still able to run his errands.  He denies any abdominal pain, nausea, vomiting, any changes to his stool, any dark stools.  He reports he takes Prilosec 20 mg on a daily basis, which she reports controls his heartburn and acid reflux. Heme negative in ED.  He does admit to drinking 3 or 4 beers a day.  He also has excessive NSAID use, admits to using 6 Goody's  Powders a day for the last 20 years for chronic back pain.  He reports that he had transfusions completed in 2009 as well as in 2014.  We do not have records of these.  He was last seen in our office in April, 2016 when he had both a colonoscopy and an upper endoscopy for further evaluation of his anemia.  His upper endoscopy was completed on May 14, 2014 with Dr.Skulskie.  Impression Z-line irregular.  4 cm hiatus hernia.  Torturous esophagus.  Gastritis.  Erosive gastritis.  Hiatus hernia.  Flat mucosa was found in the duodenum, not consistent with celiac disease.  Pathology ; duodenum, duodenal mucosa with intact villi.  No intraepithelial lymphocytosis, in active inflammation, dysplasia, or malignancy.  Antrum, moderate reactive gastropathy.  Body, superficial hemorrhage and mild chronic inactive gastritis.  Gastroesophageal junction,  Squamocolumnar mucosa with mild chronic active inflammation consistent with reflux esophagitis.  All negative for H pylori dysplasia, or malignancy.  Prominent goblet cells present in  multiple fragments.  Correlation with the endoscopic appearance is advise with regard to the possibility of Barrett's esophagus. Repeat recommended in 1 year.    Colonoscopy, May 14, 2014. Indications ; iron deficiency anemia.  Dr.Skulskie.  Impression; diverticulosis in the sigmoid colon and in the descending colon.  114 mm polyp in the proximal sigmoid colon.  Two 2 to 4  Mm polyps in the rectum.  The distal rectum and anal verge are normal on retroflexion view.  Pathology proximal colon, tubular adenoma, 2 fragments.  Rectal polyps x2, tubular adenoma, 2 fragments and hyperplastic polyp.  All negative for high-grade dysplasia and malignancy.  Recommend repeat colonoscopy in 3 years  He has a significant family history of cirrhosis in his mother.  He has never been evaluated by hematology, has been evaluated by nephrology for his chronic hyponatermia  Past Medical History:  Past Medical History  Diagnosis Date  . Hypertension   . GERD (gastroesophageal reflux disease)   . Erectile dysfunction     Problem List: Patient Active Problem List   Diagnosis Date Noted  . Anemia 11/21/2014  . Acute encephalopathy 06/06/2014  . Hyponatremia 06/06/2014  . HTN (hypertension) 06/06/2014  . GERD (gastroesophageal reflux disease) 06/06/2014    Past Surgical History: Past Surgical History  Procedure Laterality Date  . Back surgery      Allergies: Allergies  Allergen Reactions  . Benadryl [Diphenhydramine Hcl (Sleep)] Nausea And Vomiting    Home Medications: Prescriptions prior to admission  Medication Sig Dispense Refill Last  Dose  . albuterol (PROVENTIL HFA;VENTOLIN HFA) 108 (90 BASE) MCG/ACT inhaler Inhale 2 puffs into the lungs every 6 (six) hours as needed for wheezing or shortness of breath.   prn at prn  . amLODipine (NORVASC) 10 MG tablet Take 10 mg by mouth daily.   11/21/2014 at Unknown time  . lisinopril (PRINIVIL,ZESTRIL) 40 MG tablet Take 40 mg by mouth daily.   11/21/2014 at  Unknown time  . omeprazole (PRILOSEC) 40 MG capsule Take 40 mg by mouth daily.   11/21/2014 at Unknown time  . tadalafil (CIALIS) 10 MG tablet Take 10 mg by mouth daily as needed for erectile dysfunction.   prn at prn   Home medication reconciliation was completed with the patient.   Scheduled Inpatient Medications:   . docusate sodium  100 mg Oral BID  . [START ON 11/22/2014] Influenza vac split quadrivalent PF  0.5 mL Intramuscular Tomorrow-1000  . ipratropium-albuterol  3 mL Nebulization Q6H  . methylPREDNISolone (SOLU-MEDROL) injection  60 mg Intravenous Q6H  . pantoprazole (PROTONIX) IV  40 mg Intravenous Q12H    Continuous Inpatient Infusions:   . sodium chloride      PRN Inpatient Medications:  acetaminophen **OR** acetaminophen, alum & mag hydroxide-simeth, bisacodyl, HYDROcodone-acetaminophen, ondansetron **OR** ondansetron (ZOFRAN) IV  Family History: family history includes Hypertension in his father.    Social History:   reports that he has been smoking Cigarettes.  He has been smoking about 1.00 pack per day. He has never used smokeless tobacco. He reports that he does not drink alcohol.   Review of Systems: Constitutional: Weight is stable.  Eyes: No changes in vision. ENT: No oral lesions, sore throat.  GI: see HPI.  Heme/Lymph: No easy bruising.  CV: No chest pain.  GU: No hematuria.  Integumentary: No rashes.  Neuro: No headaches.  Psych: No depression/anxiety.  Endocrine: No heat/cold intolerance.  Allergic/Immunologic: No urticaria.  Resp: No cough Musculoskeletal: No joint swelling.    Physical Examination: BP 130/76 mmHg  Pulse 87  Temp(Src) 98.8 F (37.1 C) (Oral)  Resp 20  Ht '5\' 1"'$  (1.549 m)  Wt 71.215 kg (157 lb)  BMI 29.68 kg/m2  SpO2 94% Gen: NAD, alert and oriented x 4 HEENT: PEERLA, EOMI, Neck: supple, no JVD or thyromegaly Chest: CTA bilaterally, no wheezes, crackles, or other adventitious sounds CV: RRR, no m/g/c/r Abd: soft,  NT, ND, +BS in all four quadrants; no HSM, guarding, ridigity, or rebound tenderness Ext: no edema, well perfused with 2+ pulses, Skin: very pale Lymph: no LAD  Data: Lab Results  Component Value Date   WBC 6.9 11/21/2014   HGB 3.9* 11/21/2014   HCT 14.0* 11/21/2014   MCV 55.2* 11/21/2014   PLT 488* 11/21/2014    Recent Labs Lab 11/21/14 1056  HGB 3.9*   Lab Results  Component Value Date   NA 124* 11/21/2014   K 4.6 11/21/2014   CL 95* 11/21/2014   CO2 19* 11/21/2014   BUN 10 11/21/2014   CREATININE 0.87 11/21/2014   Lab Results  Component Value Date   ALT 10* 06/12/2014   AST 20 06/12/2014   ALKPHOS 99 06/12/2014   BILITOT 0.5 06/12/2014   No results for input(s): APTT, INR, PTT in the last 168 hours. Assessment/Plan: Mr. Saulters is a 54 y.o. male with history of anemia requiring multiple transfusions.  Hgb on admission 3.9 MCV 55.2, BUN/creatinine 10/0.87.  Hgb on 06/05/2014 was 13.2.  Heme negative stool in ED   Recommendations: We will  plan to do an upper endoscopy in the afternoon tomorrow for further evaluation. Dr. Vira Agar would prefer patient be given another unit of blood in the morning prior to procedure. We also strongly recommend hematology consult.  Patient also needs to maintain close follow up in our office due to chronic NSAID use and alcohol abuse.  He may have clear liquids until midnight.  Please see Dr. Percell Boston note for any further recommendations.  We will continue to follow with you. Thank you for the consult. Please call with questions or concerns.  Salvadore Farber, PA-C  I personally performed these services.

## 2014-11-21 NOTE — ED Notes (Signed)
Pt on 2L [place], pt o2 sat 100% on RA

## 2014-11-21 NOTE — Plan of Care (Signed)
Problem: Phase II Progression Outcomes Goal: No active bleeding Outcome: Progressing Pt alert and oriented x4. Pt at baseline mental status. Pt became SOB the past couple day's and came to hospital. Lives at home with a roommate. History of GI bleed in 2009 and was admitted to CCU. Up to date with his endoscopy and colonoscopy which they found ulcer's. Pt will receive 2 units of blood and GI has been consulted.  Goals of care: 1nfuse blood as ordered and recheck hemoglobin and hematocrit as ordered 2GI consult, pt is NPO right now until GI see's him. He reports no active bleeding per pt and hemoccult stool was neg ED 3 Pt on 2 liters [place] now, wean as tolerated. 4Monitor fatigue ness pt should be getting stronger after blood transfusion 5Possible need for hematology consult, platelets elevated. 6Provide information to the patient or family to stop doing activities if teladi symptoms of increased heart rate, increased blood pressure, rapid breathing, dizziness. 7Follow and communicate daily diagnostic assay to determine the presence of acute and chronic diseases as well as the source of chronic blood loss

## 2014-11-21 NOTE — ED Notes (Signed)
Pt reports increase in SOB over the past week with sx's much worse the past 2 days. Pt reports hx of COPD, requires no oxygen but uses a nebulizer.

## 2014-11-21 NOTE — Plan of Care (Signed)
Problem: Discharge Progression Outcomes Goal: Tolerating diet Outcome: Progressing NPO right now until GI see's him

## 2014-11-21 NOTE — H&P (Signed)
[facility] [place] NAME: Steven Davis    MR#:  [id number]  DATE OF BIRTH:  26-Feb-1960  DATE OF ADMISSION:  11/21/2014  PRIMARY CARE PHYSICIAN: Johny Drilling MD (was followed by [facility])  REQUESTING/REFERRING PHYSICIAN: Lenise Arena, M.D.  CHIEF COMPLAINT:   Chief Complaint  Patient presents with  . Shortness of Breath  . Respiratory Distress   HISTORY OF PRESENT ILLNESS:  Steven Davis  is a 54 y.o. male with a known history of hypertension, GERD and gastric ulcer.  He is being admitted for severe anemia and shortness of breath, likely due to same.  He reports feeling increasing shortness of breath over the last week with symptoms worse in the last 2 days. Does have history of COPD, requires no oxygen but uses a nebulizer.  He has been getting extremely short of breath with minimal walking even from one room to another room.  While in the emergency department, he was found to have hemoglobin of 3.9 and is being admitted for further evaluation and management.  He has been ordered 2 units of packed red blood cell.  He denies having any blood in the stool, although he does report having 4 units of packed red blood cell transfusion back in 2009 when he had a GI bleed.  He also reports having an upper and lower endoscopy in April 2016 when he was found to have gastric ulcers. PAST MEDICAL HISTORY:   Past Medical History  Diagnosis Date  . Hypertension   . GERD (gastroesophageal reflux disease)   . Erectile dysfunction    gastric ulcers 12 units of packed red blood cell transfusion in 2009 PAST SURGICAL HISTORY:   Past Surgical History  Procedure Laterality Date  . Back surgery     SOCIAL HISTORY:   Social History  Substance Use Topics  . Smoking status: Current Every Day Smoker -- 1.00 packs/day    Types: Cigarettes  . Smokeless tobacco: Never Used  . Alcohol Use: No   FAMILY HISTORY:   Family History   Problem Relation Age of Onset  . Hypertension Father    mother was alcoholic.  Father died of lung cancer DRUG ALLERGIES:   Allergies  Allergen Reactions  . Benadryl [Diphenhydramine Hcl (Sleep)] Nausea And Vomiting   REVIEW OF SYSTEMS:   Review of Systems  Constitutional: Positive for malaise/fatigue. Negative for fever, weight loss and diaphoresis.  HENT: Negative for ear discharge, ear pain, hearing loss, nosebleeds, sore throat and tinnitus.   Eyes: Negative for blurred vision and pain.  Respiratory: Positive for cough and shortness of breath. Negative for hemoptysis and wheezing.   Cardiovascular: Negative for chest pain, palpitations, orthopnea and leg swelling.  Gastrointestinal: Negative for heartburn, nausea, vomiting, abdominal pain, diarrhea, constipation and blood in stool.  Genitourinary: Negative for dysuria, urgency and frequency.  Musculoskeletal: Negative for myalgias and back pain.  Skin: Negative for itching and rash.  Neurological: Positive for weakness. Negative for dizziness, tingling, tremors, focal weakness, seizures and headaches.  Psychiatric/Behavioral: Negative for depression. The patient is not nervous/anxious.    MEDICATIONS AT HOME:   Prior to Admission medications   Medication Sig Start Date End Date Taking? Authorizing Provider  albuterol (PROVENTIL HFA;VENTOLIN HFA) 108 (90 BASE) MCG/ACT inhaler Inhale 2 puffs into the lungs every 6 (six) hours as needed for wheezing or shortness of breath.   Yes Historical Provider, MD  amLODipine (NORVASC) 10 MG tablet Take 10 mg by mouth daily.  Yes Historical Provider, MD  lisinopril (PRINIVIL,ZESTRIL) 40 MG tablet Take 40 mg by mouth daily.   Yes Historical Provider, MD  omeprazole (PRILOSEC) 40 MG capsule Take 40 mg by mouth daily.   Yes Historical Provider, MD  tadalafil (CIALIS) 10 MG tablet Take 10 mg by mouth daily as needed for erectile dysfunction.   Yes Historical Provider, MD   VITAL SIGNS:  Blood  pressure 114/67, pulse 79, temperature 97.8 F (36.6 C), temperature source Oral, resp. rate 16, height '5\' 1"'$  (1.549 m), weight 71.215 kg (157 lb), SpO2 96 %. PHYSICAL EXAMINATION:  Physical Exam  Constitutional: He is oriented to person, place, and time and well-developed, well-nourished, and in no distress.  HENT:  Head: Normocephalic and atraumatic.  Eyes: Conjunctivae and EOM are normal. Pupils are equal, round, and reactive to light.  Neck: Normal range of motion. Neck supple. No tracheal deviation present. No thyromegaly present.  Cardiovascular: Normal rate, regular rhythm and normal heart sounds.   Pulmonary/Chest: Tachypnea noted. He is in respiratory distress. He has wheezes. He has rhonchi. He exhibits no tenderness.  Abdominal: Soft. Bowel sounds are normal. He exhibits no distension. There is no tenderness.  Musculoskeletal: Normal range of motion.  Neurological: He is alert and oriented to person, place, and time. No cranial nerve deficit.  Skin: Skin is warm and dry. No rash noted. There is pallor.  Psychiatric: Mood and affect normal.   LABORATORY PANEL:   CBC  Recent Labs Lab 11/21/14 1056  WBC 6.9  HGB 3.9*  HCT 14.0*  PLT 488*   ------------------------------------------------------------------------------------------------------------------  Chemistries   Recent Labs Lab 11/21/14 1056  NA 124*  K 4.6  CL 95*  CO2 19*  GLUCOSE 117*  BUN 10  CREATININE 0.87  CALCIUM 8.7*   RADIOLOGY:  Dg Chest Port 1 View  11/21/2014  CLINICAL DATA:  Dyspnea EXAM: PORTABLE CHEST 1 VIEW COMPARISON:  06/05/2014 FINDINGS: Borderline cardiomegaly. Negative aortic and hilar contours. There is diffuse interstitial coarsening with small pleural effusions. Cephalized blood flow. No asymmetric consolidation. Lower thoracic and lumbar posterior fixation. Remote right seventh rib fracture. IMPRESSION: CHF pattern. Electronically Signed   By: Monte Fantasia M.D.   On: 11/21/2014  11:42   IMPRESSION AND PLAN:  54 year old male with a known history of GERD and gastric ulcer with hypertension.  He is being admitted for severe anemia  * Severe symptomatic anemia: order anemia panel, GI consult, may need upper endoscopy and known history of gastric ulcers.  Start protonic IV bid.  2 units of packed red blood cells have been ordered.  We will transfuse more as needed to keep hemoglobin 7  * Shortness of breath: Activity due to severe symptomatic anemia  * Hyponatremia: Likely due to beer potomania, he has a known history of heavy alcoholism.  Monitor for any withdrawal, and closely monitor his sodium level with aggressive IV hydration.  Consider nephro consult if no improvement  * Tobacco abuse: He was counseled for about 3 minutes not ready to quit.  He denies need for nicotine replacement therapy while in the hospital.  * Hypertension: Controlled at this time.  Continue his home medications  All the records are reviewed and case discussed with ED provider. Management plans discussed with the patient, family and they are in agreement.  CODE STATUS: Full Code  TOTAL TIME TAKING CARE OF THIS PATIENT: 55 minutes.    [facility], Dustee Bottenfield M.D on 11/21/2014 at 2:41 PM  Between 7am to 6pm - Pager -  [phone]  After 6pm go to [email or web address] - password [email or web address] [facility]  Office  [phone]  CC: Primary care physician; Johny Drilling MD (was followed by [facility])

## 2014-11-21 NOTE — Consult Note (Signed)
Pt with severe anemia, iron def.  Hx of erosive gastritis on EGD 6 months ago.  He uses [employer] and drinks alcohol.  He has some SOB and elevated BNP and likely some degree of CHF due to severe anemia.  He should have a capsule camera endoscopy after his blood is built up some and his breathing is better.  Likely Monday would be a good day for the capsule.  See Claudie Leach consult for further details.

## 2014-11-21 NOTE — ED Provider Notes (Signed)
[facility] Emergency Department Provider Note     Time seen: ----------------------------------------- 10:56 AM on 11/21/2014 -----------------------------------------    I have reviewed the triage vital signs and the nursing notes.   HISTORY  Chief Complaint Shortness of Breath and Respiratory Distress    HPI Steven Davis is a 54 y.o. male who presents ER for increasing shortness of breath over the last week with symptoms worse in the last 2 days. Does have history of COPD, requires no oxygen but uses a nebulizer. Patient denies any fevers or chills, denies any blood in his stool or urine.   Past Medical History  Diagnosis Date  . Hypertension   . GERD (gastroesophageal reflux disease)   . Erectile dysfunction     Patient Active Problem List   Diagnosis Date Noted  . Acute encephalopathy 06/06/2014  . Hyponatremia 06/06/2014  . HTN (hypertension) 06/06/2014  . GERD (gastroesophageal reflux disease) 06/06/2014    Past Surgical History  Procedure Laterality Date  . Back surgery      Allergies Benadryl  Social History Social History  Substance Use Topics  . Smoking status: Current Every Day Smoker -- 1.00 packs/day    Types: Cigarettes  . Smokeless tobacco: Never Used  . Alcohol Use: No    Review of Systems Constitutional: Negative for fever. Eyes: Negative for visual changes. ENT: Negative for sore throat. Cardiovascular: Negative for chest pain. Respiratory: Positive shortness of breath and cough Gastrointestinal: Negative for abdominal pain, vomiting and diarrhea. Genitourinary: Negative for dysuria. Musculoskeletal: Negative for back pain. Skin: Positive for pallor Neurological: Negative for headaches, focal weakness or numbness.  10-point ROS otherwise negative.  ____________________________________________   PHYSICAL EXAM:  VITAL SIGNS: ED Triage Vitals  Enc Vitals Group     BP 11/21/14 1047 129/69 mmHg   Pulse Rate 11/21/14 1047 95     Resp 11/21/14 1047 24     Temp 11/21/14 1047 97.8 F (36.6 C)     Temp Source 11/21/14 1047 Oral     SpO2 11/21/14 1047 87 %     Weight 11/21/14 1047 157 lb (71.215 kg)     Height 11/21/14 1047 '5\' 1"'$  (1.549 m)     Head Cir --      Peak Flow --      Pain Score --      Pain Loc --      Pain Edu? --      Excl. in [place]? --     Constitutional: Alert and oriented. Mild distress Eyes: Pale conjunctivae. PERRL. Normal extraocular movements. ENT   Head: Normocephalic and atraumatic.   Nose: No congestion/rhinnorhea.   Mouth/Throat: Mucous membranes are moist.   Neck: No stridor. Cardiovascular: Normal rate, regular rhythm. Normal and symmetric distal pulses are present in all extremities. No murmurs, rubs, or gallops. Respiratory: Mild to moderate rhonchi bilaterally. Gastrointestinal: Soft and nontender. No distention. No abdominal bruits.  Rectal: Heme-negative, no stool present. Musculoskeletal: Nontender with normal range of motion in all extremities. No joint effusions.  No lower extremity tenderness nor edema. Neurologic:  Normal speech and language. No gross focal neurologic deficits are appreciated. Speech is normal. No gait instability. Skin:  Pallor is noted Psychiatric: Mood and affect are normal. Speech and behavior are normal. Patient exhibits appropriate insight and judgment. ____________________________________________  ED COURSE:  Pertinent labs & imaging results that were available during my care of the patient were reviewed by me and considered in my medical decision making (see chart for details). Patient  appears dyspneic likely from COPD as well as anemia. Will check labs and reevaluate ____________________________________________   EKG: Interpreted by me, normal sinus rhythm with a rate of 80 bpm, normal PR interval, normal QRS with, normal QT interval.  LABS (pertinent positives/negatives)  Labs Reviewed  BASIC  METABOLIC PANEL - Abnormal; Notable for the following:    Sodium 124 (*)    Chloride 95 (*)    CO2 19 (*)    Glucose, Bld 117 (*)    Calcium 8.7 (*)    All other components within normal limits  BRAIN NATRIURETIC PEPTIDE - Abnormal; Notable for the following:    B Natriuretic Peptide 489.0 (*)    All other components within normal limits  TROPONIN I  CBC WITH DIFFERENTIAL/PLATELET  TYPE AND SCREEN  ABO/RH   CRITICAL CARE Performed by: Earleen Newport   Total critical care time: 30 minutes Critical care time was exclusive of separately billable procedures and treating other patients.  Critical care was necessary to treat or prevent imminent or life-threatening deterioration.  Critical care was time spent personally by me on the following activities: development of treatment plan with patient and/or surrogate as well as nursing, discussions with consultants, evaluation of patient's response to treatment, examination of patient, obtaining history from patient or surrogate, ordering and performing treatments and interventions, ordering and review of laboratory studies, ordering and review of radiographic studies, pulse oximetry and re-evaluation of patient's condition.   RADIOLOGY  Chest x-ray CHF ____________________________________________  FINAL ASSESSMENT AND PLAN  Dyspnea, anemia, congestive heart failure  Plan: Patient with labs and imaging as dictated above. Patient with CHF likely secondary to anemia. Patient also with chronic hyponatremia and chronic alcohol abuse. Patient will have blood transfusion ordered unclear etiology for his anemia.   Earleen Newport, MD   Earleen Newport, MD 11/21/14 [phone]

## 2014-11-22 ENCOUNTER — Inpatient Hospital Stay: Payer: Medicare Other | Admitting: Anesthesiology

## 2014-11-22 ENCOUNTER — Encounter
Admission: EM | Disposition: A | Discharge status: Home / Self Care | Payer: Self-pay | Source: Home / Self Care | Attending: Internal Medicine

## 2014-11-22 HISTORY — PX: ESOPHAGOGASTRODUODENOSCOPY (EGD) WITH PROPOFOL: SHX5813

## 2014-11-22 LAB — BASIC METABOLIC PANEL
Anion gap: 7 (ref 5–15)
BUN: 8 mg/dL (ref 6–20)
CALCIUM: 8.8 mg/dL — AB (ref 8.9–10.3)
CO2: 22 mmol/L (ref 22–32)
CREATININE: 0.64 mg/dL (ref 0.61–1.24)
Chloride: 99 mmol/L — ABNORMAL LOW (ref 101–111)
GFR calc Af Amer: >60 mL/min (ref >60)
GLUCOSE: 160 mg/dL — AB (ref 65–99)
POTASSIUM: 3.9 mmol/L (ref 3.5–5.1)
SODIUM: 128 mmol/L — AB (ref 135–145)

## 2014-11-22 LAB — PROTIME-INR
INR: 1.34
PROTHROMBIN TIME: 16.8 s — AB (ref 11.4–15.0)

## 2014-11-22 LAB — VITAMIN B12: VITAMIN B 12: 319 pg/mL (ref 180–914)

## 2014-11-22 LAB — HEPATIC FUNCTION PANEL
ALBUMIN: 3.5 g/dL (ref 3.5–5.0)
ALT: 9 U/L — ABNORMAL LOW (ref 17–63)
AST: 20 U/L (ref 15–41)
Alkaline Phosphatase: 75 U/L (ref 38–126)
BILIRUBIN INDIRECT: 1 mg/dL — AB (ref 0.3–0.9)
Bilirubin, Direct: 0.2 mg/dL (ref 0.1–0.5)
TOTAL PROTEIN: 6.6 g/dL (ref 6.5–8.1)
Total Bilirubin: 1.2 mg/dL (ref 0.3–1.2)

## 2014-11-22 LAB — CBC
HEMATOCRIT: 20.6 % — AB (ref 40.0–52.0)
Hemoglobin: 6.1 g/dL — ABNORMAL LOW (ref 13.0–18.0)
MCH: 18.1 pg — ABNORMAL LOW (ref 26.0–34.0)
MCHC: 29.5 g/dL — AB (ref 32.0–36.0)
MCV: 61.3 fL — ABNORMAL LOW (ref 80.0–100.0)
PLATELETS: 458 10*3/uL — AB (ref 150–440)
RBC: 3.37 MIL/uL — ABNORMAL LOW (ref 4.40–5.90)
RDW: 28.7 % — AB (ref 11.5–14.5)
WBC: 6 10*3/uL (ref 3.8–10.6)

## 2014-11-22 LAB — PATHOLOGIST SMEAR REVIEW: Path Review: REACTIVE

## 2014-11-22 LAB — GLUCOSE, CAPILLARY
GLUCOSE-CAPILLARY: 153 mg/dL — AB (ref 65–99)
GLUCOSE-CAPILLARY: 161 mg/dL — AB (ref 65–99)

## 2014-11-22 LAB — APTT: APTT: 35 s (ref 24–36)

## 2014-11-22 LAB — PREPARE RBC (CROSSMATCH)

## 2014-11-22 SURGERY — ESOPHAGOGASTRODUODENOSCOPY (EGD) WITH PROPOFOL
Anesthesia: General | Laterality: Left

## 2014-11-22 MED ORDER — FENTANYL CITRATE (PF) 100 MCG/2ML IJ SOLN
INTRAMUSCULAR | Status: DC | PRN
  Administered 2014-11-22: 50 ug via INTRAVENOUS

## 2014-11-22 MED ORDER — PROPOFOL 500 MG/50ML IV EMUL
INTRAVENOUS | Status: DC | PRN
  Administered 2014-11-22: 100 ug/kg/min via INTRAVENOUS

## 2014-11-22 MED ORDER — FUROSEMIDE 10 MG/ML IJ SOLN
20.0000 mg | Freq: Two times a day (BID) | INTRAMUSCULAR | Status: DC
  Administered 2014-11-23 (×2): 20 mg via INTRAVENOUS
  Filled 2014-11-22 (×3): qty 2

## 2014-11-22 MED ORDER — SODIUM CHLORIDE 0.9 % IV SOLN
Freq: Once | INTRAVENOUS | Status: AC
  Administered 2014-11-22: 14:00:00 via INTRAVENOUS

## 2014-11-22 MED ORDER — MOMETASONE FURO-FORMOTEROL FUM 100-5 MCG/ACT IN AERO
2.0000 | INHALATION_SPRAY | Freq: Two times a day (BID) | RESPIRATORY_TRACT | Status: DC
  Administered 2014-11-22 – 2014-11-23 (×2): 2 via RESPIRATORY_TRACT
  Filled 2014-11-22: qty 8.8

## 2014-11-22 MED ORDER — FUROSEMIDE 10 MG/ML IJ SOLN
20.0000 mg | Freq: Once | INTRAMUSCULAR | Status: AC
  Administered 2014-11-22: 20 mg via INTRAVENOUS
  Filled 2014-11-22: qty 2

## 2014-11-22 NOTE — Progress Notes (Signed)
Initial Nutrition Assessment     INTERVENTION:  Meals and snacks: Await diet progression following procedure   NUTRITION DIAGNOSIS:   Inadequate oral intake related to altered GI function as evidenced by NPO status.    GOAL:   Patient will meet greater than or equal to 90% of their needs    MONITOR:    (Energy intake, Digestive system, )  REASON FOR ASSESSMENT:   Consult Assessment of nutrition requirement/status  ASSESSMENT:      Pt admitted with severe anemia. Planning EGD today   Past Medical History  Diagnosis Date  . Hypertension   . GERD (gastroesophageal reflux disease)   . Erectile dysfunction    Noted pt with Etoh use prior to admission   Current Nutrition: deferred visit with pt today secondary to pt frustrated that not able to eat secondary to testing today, NPO  Food/Nutrition-Related History: unable to address at this time   Scheduled Medications:  . sodium chloride   Intravenous Once  . docusate sodium  100 mg Oral BID  . furosemide  20 mg Intravenous Q12H  . Influenza vac split quadrivalent PF  0.5 mL Intramuscular Tomorrow-1000  . ipratropium-albuterol  3 mL Nebulization Q6H  . mometasone-formoterol  2 puff Inhalation BID  . pantoprazole (PROTONIX) IV  40 mg Intravenous Q12H  . sodium chloride  3 mL Intravenous Q12H    Continuous Medications:  . sodium chloride Stopped (11/21/14 2021)     Electrolyte/Renal Profile and Glucose Profile:   Recent Labs Lab 11/21/14 1056 11/22/14 0533  NA 124* 128*  K 4.6 3.9  CL 95* 99*  CO2 19* 22  BUN 10 8  CREATININE 0.87 0.64  CALCIUM 8.7* 8.8*  GLUCOSE 117* 160*    Gastrointestinal Profile: Last BM: 10/27   Nutrition-Focused Physical Exam Findings:  Unable to complete Nutrition-Focused physical exam at this time.     Weight Change: per wt encounters wt gain prior to admission    Diet Order:  Diet NPO time specified  Skin:   reviewed   Height:   Ht Readings from Last  1 Encounters:  11/21/14 '5\' 1"'$  (1.549 m)    Weight:   Wt Readings from Last 1 Encounters:  11/21/14 157 lb (71.215 kg)   BMI:  Body mass index is 29.68 kg/(m^2).  Estimated Nutritional Needs:   Kcal:  BEE 1208 kcals (IF 1.0-1.2, AF 1.3) 2836-6294 kcals/d.  Using IBW of 51kg  Protein:  (1.0-1.2 g/kg) 51-61 g/kg  Fluid:  (25-52m/kg) 1275-15347md  EDUCATION NEEDS:   No education needs identified at this time  MORoodhouseAlZenia [email or web address])

## 2014-11-22 NOTE — Progress Notes (Signed)
PT Cancellation Note  Patient Details Name: Steven Davis MRN: [id number] DOB: Jul 25, 1960   Cancelled Treatment:    Reason Eval/Treat Not Completed: Medical issues which prohibited therapy. PT reviewed patient's chart, per notes available patient admitted with hemoglobin of 3.9, with a transfusion up to 6.1 (baseline of 13.2 on 06/05/2014). PT discussed with RN who stated she was unsure if bleed was stable or active and that patient would be going for EGD to determine later today. Given the above RN informed PT to hold mobility evaluation for now and monitor patient after EGD performed. PT will continue to follow.   Kerman Passey, PT, DPT    11/22/2014, 9:33 AM

## 2014-11-22 NOTE — Clinical Documentation Improvement (Signed)
Internal Medicine  Can the diagnosis of CHF be further specified?    Acuity - Acute, Chronic, Acute on Chronic   Type - Systolic, Diastolic, Systolic and Diastolic  Other  Clinically Undetermined  Document any associated diagnoses/conditions  Please update your documentation within the medical record to reflect your response to this query. Thank you  Supporting Information: "some degree of CHF due to severe anemia."  "Patient with CHF likely secondary to anemia."  CXR 11/21/14 IMPRESSION: CHF pattern.  Please exercise your independent, professional judgment when responding. A specific answer is not anticipated or expected.  Thank You, Alessandra Grout, RN, BSN, CCDS,Clinical Documentation Specialist:  [phone]  [phone]=[facility] [place]- Health Information Management

## 2014-11-22 NOTE — Anesthesia Preprocedure Evaluation (Signed)
Anesthesia Evaluation  Patient identified by MRN, date of birth, ID band Patient awake    Reviewed: Allergy & Precautions, NPO status , Patient's Chart, lab work & pertinent test results  Airway Mallampati: II       Dental  (+) Edentulous Lower, Edentulous Upper   Pulmonary COPD, Current Smoker,    + rhonchi  + decreased breath sounds      Cardiovascular hypertension, Pt. on medications Normal cardiovascular exam     Neuro/Psych    GI/Hepatic Neg liver ROS, GERD  ,  Endo/Other  negative endocrine ROS  Renal/GU negative Renal ROS     Musculoskeletal negative musculoskeletal ROS (+)   Abdominal Normal abdominal exam  (+)   Peds negative pediatric ROS (+)  Hematology  (+) anemia ,   Anesthesia Other Findings   Reproductive/Obstetrics negative OB ROS                             Anesthesia Physical Anesthesia Plan  ASA: III  Anesthesia Plan: General   Post-op Pain Management:    Induction: Intravenous  Airway Management Planned: Nasal Cannula  Additional Equipment:   Intra-op Plan:   Post-operative Plan:   Informed Consent: I have reviewed the patients History and Physical, chart, labs and discussed the procedure including the risks, benefits and alternatives for the proposed anesthesia with the patient or authorized representative who has indicated his/her understanding and acceptance.     Plan Discussed with: CRNA  Anesthesia Plan Comments:         Anesthesia Quick Evaluation

## 2014-11-22 NOTE — Progress Notes (Signed)
OT Cancellation Note  Patient Details Name: Steven Davis MRN: [id number] DOB: [birth year]   Cancelled Treatment:    Reason Eval/Treat Not Completed: Medical issues which prohibited therapy. Per chart review and Physical Therapy note will hold patient.  Sharon Mt 11/22/2014, 9:42 AM

## 2014-11-22 NOTE — Op Note (Signed)
[facility] Gastroenterology Patient Name: Steven Davis Procedure Date: 11/22/2014 4:56 PM MRN: [id number] Account #: [id number] Date of Birth: 1960-02-26 Admit Type: Inpatient Age: 54 Room: [facility] ENDO ROOM 1 Gender: Male Note Status: Finalized Procedure:         Upper GI endoscopy Indications:       Unexplained iron deficiency anemia, Heme positive stool Providers:         Manya Silvas, MD Referring MD:      Daniel Nones, MD (Referring MD) Medicines:         Propofol per Anesthesia Complications:     No immediate complications. Procedure:         Pre-Anesthesia Assessment:                    - After reviewing the risks and benefits, the patient was                     deemed in satisfactory condition to undergo the procedure.                    After obtaining informed consent, the endoscope was passed                     under direct vision. Throughout the procedure, the                     patient's blood pressure, pulse, and oxygen saturations                     were monitored continuously. The Endoscope was introduced                     through the mouth, and advanced to the second part of                     duodenum. The upper GI endoscopy was accomplished without                     difficulty. The patient tolerated the procedure well. Findings:      A mild Schatzki ring (acquired) was found at the gastroesophageal       junction.      A small hiatus hernia was present.      The esophagus was otherwise normal.      The stomach was otherwise normal.      The examined duodenum was normal. Impression:        - Mild Schatzki ring.                    - Small hiatus hernia.                    - Normal esophagus.                    - Normal stomach.                    - Normal examined duodenum.                    - No specimens collected. Recommendation:    - The findings and recommendations were discussed with the                     patient. Out  patient capsule endoscopy Gavin Pound  Vira Agar, MD 11/22/2014 5:10:12 PM This report has been signed electronically. Number of Addenda: 0 Note Initiated On: 11/22/2014 4:56 PM      [facility]

## 2014-11-22 NOTE — Plan of Care (Signed)
Problem: Discharge Progression Outcomes Goal: Other Discharge Outcomes/Goals Outcome: Progressing Assumed care at 2300 Admitted yesterday Hemodynamics: Hgb 3.9 upon admission. Pt received 2 units of prbc's. F/u labs this am. Seen by GI. Pulmonary cx pending.  Pain: No complaints of pain this shift. Diet: Clear liquids prior to midnight. Currently NPO for EGD today.  Activity: Pt up with minimal assist.

## 2014-11-22 NOTE — Progress Notes (Signed)
[facility]                                                                                                                                                                                            Patient Demographics   Steven Davis, is a 54 y.o. male, DOB - 11-Apr-1960, MVH:[id number]  Admit date - 11/21/2014   Admitting Physician Max Sane, MD  Outpatient Primary MD for the patient is No primary care provider on file.   LOS - 1  Subjective: Patient reports that he is feeling fine very anxious about not eating. He states that is gone orders lunch from outside if no food by then. Hemoglobin is still low at 6.1. He is still on 4 L of oxygen. He is not on any oxygen at home. He denies any chest pains or palpitations     Review of Systems:   CONSTITUTIONAL: No documented fever. No fatigue, weakness. No weight gain, no weight loss.  EYES: No blurry or double vision.  ENT: No tinnitus. No postnasal drip. No redness of the oropharynx.  RESPIRATORY: No cough, no wheeze, no hemoptysis. No dyspnea.  CARDIOVASCULAR: No chest pain. No orthopnea. No palpitations. No syncope.  GASTROINTESTINAL: No nausea, no vomiting or diarrhea. No abdominal pain. No melena or hematochezia.  GENITOURINARY: No dysuria or hematuria.  ENDOCRINE: No polyuria or nocturia. No heat or cold intolerance.  HEMATOLOGY: No anemia. No bruising. No bleeding.  INTEGUMENTARY: No rashes. No lesions.  MUSCULOSKELETAL: No arthritis. No swelling. No gout.  NEUROLOGIC: No numbness, tingling, or ataxia. No seizure-type activity.  PSYCHIATRIC: No anxiety. No insomnia. No ADD.    Vitals:   Filed Vitals:   11/21/14 2028 11/22/14 0243 11/22/14 0502 11/22/14 0732  BP: 141/72  119/66   Pulse: 90  92   Temp: 98.3 F (36.8 C)  98.4 F (36.9 C)   TempSrc: Oral  Oral   Resp: 20  18   Height:      Weight:      SpO2: 91% 96% 100% 98%    Wt Readings from Last 3 Encounters:   11/21/14 71.215 kg (157 lb)  06/12/14 69.854 kg (154 lb)  06/10/14 69.854 kg (154 lb)     Intake/Output Summary (Last 24 hours) at 11/22/14 1116 Last data filed at 11/22/14 1051  Gross per 24 hour  Intake 584.58 ml  Output   2350 ml  Net -1765.42 ml    Physical Exam:   GENERAL: Pleasant-appearing in no apparent distress.  HEAD, EYES, EARS, NOSE AND THROAT: Atraumatic, normocephalic. Extraocular muscles are intact. Pupils equal and reactive  to light. Sclerae anicteric. No conjunctival injection. No oro-pharyngeal erythema.  NECK: Supple. There is no jugular venous distention. No bruits, no lymphadenopathy, no thyromegaly.  HEART: Regular rate and rhythm,. No murmurs, no rubs, no clicks.  LUNGS: Occasional crackles at the basis ABDOMEN: Soft, flat, nontender, nondistended. Has good bowel sounds. No hepatosplenomegaly appreciated.  EXTREMITIES: No evidence of any cyanosis, clubbing, or peripheral edema.  +2 pedal and radial pulses bilaterally.  NEUROLOGIC: The patient is alert, awake, and oriented x3 with no focal motor or sensory deficits appreciated bilaterally.  SKIN: Moist and warm with no rashes appreciated.  Psych: Positive anxious, not depressed LN: No inguinal LN enlargement    Antibiotics   Anti-infectives    None      Medications   Scheduled Meds: . sodium chloride   Intravenous Once  . docusate sodium  100 mg Oral BID  . furosemide  20 mg Intravenous Once  . Influenza vac split quadrivalent PF  0.5 mL Intramuscular Tomorrow-1000  . ipratropium-albuterol  3 mL Nebulization Q6H  . methylPREDNISolone (SOLU-MEDROL) injection  60 mg Intravenous Q6H  . pantoprazole (PROTONIX) IV  40 mg Intravenous Q12H  . sodium chloride  3 mL Intravenous Q12H   Continuous Infusions: . sodium chloride Stopped (11/21/14 2021)   PRN Meds:.acetaminophen **OR** acetaminophen, alum & mag hydroxide-simeth, bisacodyl, HYDROcodone-acetaminophen, ondansetron **OR** ondansetron (ZOFRAN)  IV, sodium chloride   Data Review:   Micro Results No results found for this or any previous visit (from the past 240 hour(s)).  Radiology Reports Dg Chest Port 1 View  11/21/2014  CLINICAL DATA:  Dyspnea EXAM: PORTABLE CHEST 1 VIEW COMPARISON:  06/05/2014 FINDINGS: Borderline cardiomegaly. Negative aortic and hilar contours. There is diffuse interstitial coarsening with small pleural effusions. Cephalized blood flow. No asymmetric consolidation. Lower thoracic and lumbar posterior fixation. Remote right seventh rib fracture. IMPRESSION: CHF pattern. Electronically Signed   By: Monte Fantasia M.D.   On: 11/21/2014 11:42     CBC  Recent Labs Lab 11/21/14 1056 11/22/14 0533  WBC 6.9 6.0  HGB 3.9* 6.1*  HCT 14.0* 20.6*  PLT 488* 458*  MCV 55.2* 61.3*  MCH 15.5* 18.1*  MCHC 28.0* 29.5*  RDW 21.3* 28.7*  LYMPHSABS 0.9*  --   MONOABS 0.5  --   EOSABS 0.2  --   BASOSABS 0.2*  --     Chemistries   Recent Labs Lab 11/21/14 1056 11/22/14 0533  NA 124* 128*  K 4.6 3.9  CL 95* 99*  CO2 19* 22  GLUCOSE 117* 160*  BUN 10 8  CREATININE 0.87 0.64  CALCIUM 8.7* 8.8*   ------------------------------------------------------------------------------------------------------------------ estimated creatinine clearance is 88.4 mL/min (by C-G formula based on Cr of 0.64). ------------------------------------------------------------------------------------------------------------------ No results for input(s): HGBA1C in the last 72 hours. ------------------------------------------------------------------------------------------------------------------ No results for input(s): CHOL, HDL, LDLCALC, TRIG, CHOLHDL, LDLDIRECT in the last 72 hours. ------------------------------------------------------------------------------------------------------------------ No results for input(s): TSH, T4TOTAL, T3FREE, THYROIDAB in the last 72 hours.  Invalid input(s):  FREET3 ------------------------------------------------------------------------------------------------------------------  Recent Labs  11/21/14 1056  FOLATE 20.1  FERRITIN 7*  TIBC 550*  IRON 6*  RETICCTPCT 3.1    Coagulation profile  Recent Labs Lab 11/21/14 1056 11/22/14 0533  INR 1.32 1.34    No results for input(s): DDIMER in the last 72 hours.  Cardiac Enzymes  Recent Labs Lab 11/21/14 1056  TROPONINI <0.03   ------------------------------------------------------------------------------------------------------------------ Invalid input(s): POCBNP    Assessment & Plan   54 year old male with a known history of GERD and gastric ulcer with  hypertension. He is being admitted for severe anemia  * Severe symptomatic iron deficiency anemia: Appreciate GI input plan for endoscopy today. Also endoscopy on Monday. However patient states that he does not want to stay in the hospital till then. We will have to assess over the weekend. Transfuse 1 more unit of packed RBC. Continue Protonix.  * Shortness of breath: Activity due to severe symptomatic anemia chest x-ray suggestive of fluid overload I will try IV Lasix stop normal saline,   * Hyponatremia: Likely due to beer potomania, he has a known history of heavy alcoholism. likely chronic in nature monitor sodium level  * Tobacco abuse:  counseled by the admitting physician  * Hypertension: Controlled at this time.Currently not on any medications monitor blood pressure   All the records are reviewed and case discussed with ED provider. Management plans discussed with the patient, family and they are in agreement.      Code Status Orders        Start     Ordered   11/21/14 1616  Full code   Continuous     11/21/14 1616           Consults  GI   DVT Prophylaxis  SCDs  Lab Results  Component Value Date   PLT 458* 11/22/2014     Time Spent in minutes   45 minutes     Dustin Flock M.D on  11/22/2014 at 11:16 AM  Between 7am to 6pm - Pager - [phone]  After 6pm go to [email or web address] - password [email or web address] [place] [facility]   Office  [phone]

## 2014-11-22 NOTE — Transfer of Care (Signed)
Immediate Anesthesia Transfer of Care Note  Patient: Steven Davis  Procedure(s) Performed: Procedure(s): ESOPHAGOGASTRODUODENOSCOPY (EGD) WITH PROPOFOL (Left)  Patient Location: PACU and Endoscopy Unit  Anesthesia Type:General  Level of Consciousness: awake, alert  and oriented  Airway & Oxygen Therapy: Patient Spontanous Breathing and Patient connected to nasal cannula oxygen  Post-op Assessment: Report given to RN and Post -op Vital signs reviewed and stable  Post vital signs: Reviewed and stable  Last Vitals:  Filed Vitals:   11/22/14 1715  BP: 130/86  Pulse: 99  Temp: 38.3 C  Resp: 23    Complications: No apparent anesthesia complications

## 2014-11-22 NOTE — Care Management (Signed)
Admitted to [facility] with the diagnosis of anemia. Lives with roommate, [name].  Daughter is Aleda Grana [phone]). Last seen in May-June at [facility] by Dr. Jefm Bryant. Seen at [facility] in the past. Would like to be followed by [facility] in [facility] when discharged. Receives disability for back problems. Wants to get prescriptions at [facility] in [place], but has gotten prescriptions filled at [facility] in the past. Takes care of all basic activities of daily living himself, drives. Roommate takes care of all instrumental activities, but "I pay the bills." No home Health. No skilled facility. No home oxygen. No skilled facility. Uses a cane, but needed. No falls. Good appetite. 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 Family/friend will transport. NPO Scheduled for EGD today. Shelbie Ammons RN MSN CCM Care Management [phone]

## 2014-11-22 NOTE — Consult Note (Signed)
Pulmonary Critical Care  Initial Consult Note   Steven Davis XKG:[id number] DOB: 04-11-1960 DOA: 11/21/2014  Referring physician: Max Sane, MD PCP: No primary care provider on file.   Chief Complaint: Shortness of Breath  HPI: Steven Davis is a 54 y.o. male with prior history of COPD presented to the hospital with increased shortness of breath,. Patient states taht he has been having increasing dyspnea and has been having to stop to cathc his breath. He gets better with sitting down. He admits to smoking and has COPD as noted. Patient was noted to have a Hgb of 3.9 on presentation. He has been scheduled for EGD and GI workup. Patient states that he is feeling better with the transfusion.   Review of Systems:  Constitutional:  No weight loss, night sweats, Fevers, chills, +fatigue.  HEENT:  No headaches, nasal congestion, post nasal drip,  Cardio-vascular:  No chest pain, Orthopnea, PND, swelling in lower extremities GI:  No heartburn, indigestion, abdominal pain, nausea, vomiting, diarrhea  Resp:  +shortness of breath. No coughing up of blood Skin:  no rash or lesions.  Musculoskeletal:  No joint pain or swelling  Remainder ROS performed and is unremarkable other than noted in HPI  Past Medical History  Diagnosis Date  . Hypertension   . GERD (gastroesophageal reflux disease)   . Erectile dysfunction    Past Surgical History  Procedure Laterality Date  . Back surgery     Social History:  reports that he has been smoking Cigarettes.  He has been smoking about 1.00 pack per day. He has never used smokeless tobacco. He reports that he does not drink alcohol. His drug history is not on file.  Allergies  Allergen Reactions  . Benadryl [Diphenhydramine Hcl (Sleep)] Nausea And Vomiting    Family History  Problem Relation Age of Onset  . Hypertension Father     Prior to Admission medications   Medication Sig Start Date End Date Taking? Authorizing Provider   albuterol (PROVENTIL HFA;VENTOLIN HFA) 108 (90 BASE) MCG/ACT inhaler Inhale 2 puffs into the lungs every 6 (six) hours as needed for wheezing or shortness of breath.   Yes Historical Provider, MD  amLODipine (NORVASC) 10 MG tablet Take 10 mg by mouth daily.   Yes Historical Provider, MD  lisinopril (PRINIVIL,ZESTRIL) 40 MG tablet Take 40 mg by mouth daily.   Yes Historical Provider, MD  omeprazole (PRILOSEC) 40 MG capsule Take 40 mg by mouth daily.   Yes Historical Provider, MD  tadalafil (CIALIS) 10 MG tablet Take 10 mg by mouth daily as needed for erectile dysfunction.   Yes Historical Provider, MD   Physical Exam: Filed Vitals:   11/21/14 2028 11/22/14 0243 11/22/14 0502 11/22/14 0732  BP: 141/72  119/66   Pulse: 90  92   Temp: 98.3 F (36.8 C)  98.4 F (36.9 C)   TempSrc: Oral  Oral   Resp: 20  18   Height:      Weight:      SpO2: 91% 96% 100% 98%    Wt Readings from Last 3 Encounters:  11/21/14 71.215 kg (157 lb)  06/12/14 69.854 kg (154 lb)  06/10/14 69.854 kg (154 lb)    General:  Appears calm and comfortable Eyes: PERRL, normal lids, irises & conjunctiva ENT: grossly normal hearing, lips & tongue Neck: no LAD, masses or thyromegaly Cardiovascular: RRR, no m/r/g. No LE edema. Respiratory: CTA bilaterally, no w/r/r Abdomen: soft, nontender Skin: no rash or induration seen on limited exam Musculoskeletal:  grossly normal tone BUE/BLE Psychiatric: grossly normal mood and affect Neurologic: grossly non-focal.          Labs on Admission:  Basic Metabolic Panel:  Recent Labs Lab 11/21/14 1056 11/22/14 0533  NA 124* 128*  K 4.6 3.9  CL 95* 99*  CO2 19* 22  GLUCOSE 117* 160*  BUN 10 8  CREATININE 0.87 0.64  CALCIUM 8.7* 8.8*   Liver Function Tests: No results for input(s): AST, ALT, ALKPHOS, BILITOT, PROT, ALBUMIN in the last 168 hours. No results for input(s): LIPASE, AMYLASE in the last 168 hours. No results for input(s): AMMONIA in the last 168  hours. CBC:  Recent Labs Lab 11/21/14 1056 11/22/14 0533  WBC 6.9 6.0  NEUTROABS 5.1  --   HGB 3.9* 6.1*  HCT 14.0* 20.6*  MCV 55.2* 61.3*  PLT 488* 458*   Cardiac Enzymes:  Recent Labs Lab 11/21/14 1056  TROPONINI <0.03    BNP (last 3 results)  Recent Labs  11/21/14 1056  BNP 489.0*    ProBNP (last 3 results) No results for input(s): PROBNP in the last 8760 hours.  CBG:  Recent Labs Lab 11/22/14 0755 11/22/14 1146  GLUCAP 153* 161*    Radiological Exams on Admission: Dg Chest Port 1 View  11/21/2014  CLINICAL DATA:  Dyspnea EXAM: PORTABLE CHEST 1 VIEW COMPARISON:  06/05/2014 FINDINGS: Borderline cardiomegaly. Negative aortic and hilar contours. There is diffuse interstitial coarsening with small pleural effusions. Cephalized blood flow. No asymmetric consolidation. Lower thoracic and lumbar posterior fixation. Remote right seventh rib fracture. IMPRESSION: CHF pattern. Electronically Signed   By: Monte Fantasia M.D.   On: 11/21/2014 11:42    EKG: Independently reviewed.  Assessment/Plan Active Problems:   Anemia   1. Shortness of Breath -likely due to anemia and underlying COPD -patient seems to be better after blood transfusion -oxygen as needed  2. COPD -continue with inhalers as needed -oxygen as needed -add dulera BID  3. Anemia -transfuse as needed -GI workup as ordered  Time Spent: 10mn  Code Status: full code (must indicate code status--if unknown or must be presumed, indicate so)  Family Communication: none (indicate person spoken with, if applicable, with phone number if by telephone) Disposition Plan: home (indicate anticipated LOS)      I have personally obtained a history, examined the patient, evaluated laboratory and imaging results, formulated the assessment and plan and placed orders.  The Patient requires high complexity decision making for assessment and support.    SAllyne Gee MD FNorth Star Hospital - Bragaw CampusPulmonary Critical  Care Medicine Sleep Medicine

## 2014-11-22 NOTE — Plan of Care (Signed)
Problem: Discharge Progression Outcomes Goal: Other Discharge Outcomes/Goals Outcome: Progressing Patient has no complaints of pain. VSS. IV fluids infusing. 1 unit of blood given without complications. EGD performed. Patient remains on 2L oxygen.

## 2014-11-23 ENCOUNTER — Encounter: Payer: Self-pay | Admitting: Gastroenterology

## 2014-11-23 LAB — BASIC METABOLIC PANEL
Anion gap: 11 (ref 5–15)
BUN: 13 mg/dL (ref 6–20)
CHLORIDE: 98 mmol/L — AB (ref 101–111)
CO2: 22 mmol/L (ref 22–32)
CREATININE: 0.76 mg/dL (ref 0.61–1.24)
Calcium: 9 mg/dL (ref 8.9–10.3)
GFR calc Af Amer: >60 mL/min (ref >60)
GFR calc non Af Amer: >60 mL/min (ref >60)
GLUCOSE: 139 mg/dL — AB (ref 65–99)
Potassium: 3.4 mmol/L — ABNORMAL LOW (ref 3.5–5.1)
Sodium: 131 mmol/L — ABNORMAL LOW (ref 135–145)

## 2014-11-23 LAB — TYPE AND SCREEN
ABO/RH(D): A NEG
ANTIBODY SCREEN: NEGATIVE
UNIT DIVISION: 0
Unit division: 0
Unit division: 0

## 2014-11-23 LAB — GLUCOSE, CAPILLARY: Glucose-Capillary: 126 mg/dL — ABNORMAL HIGH (ref 65–99)

## 2014-11-23 LAB — CBC
HEMATOCRIT: 24.6 % — AB (ref 40.0–52.0)
HEMOGLOBIN: 7.4 g/dL — AB (ref 13.0–18.0)
MCH: 19.2 pg — AB (ref 26.0–34.0)
MCHC: 30.1 g/dL — AB (ref 32.0–36.0)
MCV: 63.9 fL — AB (ref 80.0–100.0)
Platelets: 499 10*3/uL — ABNORMAL HIGH (ref 150–440)
RBC: 3.85 MIL/uL — ABNORMAL LOW (ref 4.40–5.90)
RDW: 32.1 % — ABNORMAL HIGH (ref 11.5–14.5)
WBC: 11.7 10*3/uL — ABNORMAL HIGH (ref 3.8–10.6)

## 2014-11-23 NOTE — Progress Notes (Signed)
Patient discharged to home via wheelchair by nursing staff. Madlyn Frankel, RN

## 2014-11-23 NOTE — Progress Notes (Signed)
Patient refused influenza vaccine. Madlyn Frankel, RN

## 2014-11-23 NOTE — Consult Note (Signed)
  GI Inpatient Follow-up Note  Patient Identification: Steven Davis is a 54 y.o. male with Fe def anemia.  Subjective:Covering for Dr. Vira Agar. No complaints. EGD neg. Capsule endoscopy is being scheduled for this Thurs? Eating well. Hgb stable now.  Scheduled Inpatient Medications:  . docusate sodium  100 mg Oral BID  . furosemide  20 mg Intravenous Q12H  . Influenza vac split quadrivalent PF  0.5 mL Intramuscular Tomorrow-1000  . ipratropium-albuterol  3 mL Nebulization Q6H  . mometasone-formoterol  2 puff Inhalation BID  . sodium chloride  3 mL Intravenous Q12H    Continuous Inpatient Infusions:     PRN Inpatient Medications:  acetaminophen **OR** acetaminophen, alum & mag hydroxide-simeth, bisacodyl, HYDROcodone-acetaminophen, ondansetron **OR** ondansetron (ZOFRAN) IV, sodium chloride  Review of Systems: Constitutional: Weight is stable.  Eyes: No changes in vision. ENT: No oral lesions, sore throat.  GI: see HPI.  Heme/Lymph: No easy bruising.  CV: No chest pain.  GU: No hematuria.  Integumentary: No rashes.  Neuro: No headaches.  Psych: No depression/anxiety.  Endocrine: No heat/cold intolerance.  Allergic/Immunologic: No urticaria.  Resp: No cough, SOB.  Musculoskeletal: No joint swelling.    Physical Examination: BP 113/73 mmHg  Pulse 95  Temp(Src) 97.9 F (36.6 C) (Oral)  Resp 20  Ht '5\' 1"'$  (1.549 m)  Wt 68.266 kg (150 lb 8 oz)  BMI 28.45 kg/m2  SpO2 97% Gen: NAD, alert and oriented x 4 HEENT: PEERLA, EOMI, Neck: supple, no JVD or thyromegaly Chest: CTA bilaterally, no wheezes, crackles, or other adventitious sounds CV: RRR, no m/g/c/r Abd: soft, NT, ND, +BS in all four quadrants; no HSM, guarding, ridigity, or rebound tenderness Ext: no edema, well perfused with 2+ pulses, Skin: no rash or lesions noted Lymph: no LAD  Data: Lab Results  Component Value Date   WBC 11.7* 11/23/2014   HGB 7.4* 11/23/2014   HCT 24.6* 11/23/2014   MCV 63.9*  11/23/2014   PLT 499* 11/23/2014    Recent Labs Lab 11/21/14 1056 11/22/14 0533 11/23/14 0507  HGB 3.9* 6.1* 7.4*   Lab Results  Component Value Date   NA 131* 11/23/2014   K 3.4* 11/23/2014   CL 98* 11/23/2014   CO2 22 11/23/2014   BUN 13 11/23/2014   CREATININE 0.76 11/23/2014   Lab Results  Component Value Date   ALT 9* 11/22/2014   AST 20 11/22/2014   ALKPHOS 75 11/22/2014   BILITOT 1.2 11/22/2014    Recent Labs Lab 11/22/14 0533  APTT 35  INR 1.34   Assessment/Plan: Mr. Stancil is a 54 y.o. male with Fe def anemia.   Recommendations: Stable for discharge. Capsule endoscopy as outpt. Will sign off. thanks Please call with questions or concerns.  Padme Arriaga, Lupita Dawn, MD

## 2014-11-23 NOTE — Progress Notes (Signed)
Discharge instructions given and went over with patient at bedside. Education provided. All questions answered. Patient to be discharged home with family when transportation is available. Patient is showering prior to discharge - MD order placed. Madlyn Frankel, RN

## 2014-11-23 NOTE — Plan of Care (Signed)
Problem: Discharge Progression Outcomes Goal: Other Discharge Outcomes/Goals Outcome: Progressing Plan of care progress to goals: 1. No c/o pain, resting comfortably in bed throughout the night. 2. Hemodynamically:             -VSS, afebrile              -Hgb 6.45m1 unit blood received yesterday, will be rechecked this AM             -no s/s of bleeding noted  3. Tolerating regular diet without problem 4. Moderate fall risk. Safely ambulates independently in room.

## 2014-11-24 ENCOUNTER — Encounter: Payer: Self-pay | Admitting: Unknown Physician Specialty

## 2014-11-25 NOTE — Anesthesia Postprocedure Evaluation (Signed)
  Anesthesia Post-op Note  Patient: Steven Davis  Procedure(s) Performed: Procedure(s): ESOPHAGOGASTRODUODENOSCOPY (EGD) WITH PROPOFOL (Left)  Anesthesia type:General  Patient location: PACU  Post pain: Pain level controlled  Post assessment: Post-op Vital signs reviewed, Patient's Cardiovascular Status Stable, Respiratory Function Stable, Patent Airway and No signs of Nausea or vomiting  Post vital signs: Reviewed and stable  Last Vitals:  Filed Vitals:   11/23/14 0500  BP: 113/73  Pulse: 95  Temp: 36.6 C  Resp: 20    Level of consciousness: awake, alert  and patient cooperative  Complications: No apparent anesthesia complications

## 2014-11-26 NOTE — Discharge Summary (Signed)
Steven Davis, is a 54 y.o. male  DOB Jul 04, 1960  MRN [id number].  Admission date:  11/21/2014  Admitting Physician  Max Sane, MD  Discharge Date:  11/23/2007   Primary MD  No primary care provider on file.  Recommendations for primary care physician for things to follow:     Admission Diagnosis  Hyponatremia [E87.1] Dyspnea [R06.00] Anemia, unspecified anemia type [D64.9]   Discharge Diagnosis  Hyponatremia [E87.1] Dyspnea [R06.00] Anemia, unspecified anemia type [D64.9]    Active Problems:   Anemia      Past Medical History  Diagnosis Date  . Hypertension   . GERD (gastroesophageal reflux disease)   . Erectile dysfunction     Past Surgical History  Procedure Laterality Date  . Back surgery    . Esophagogastroduodenoscopy (egd) with propofol Left 11/22/2014    Procedure: ESOPHAGOGASTRODUODENOSCOPY (EGD) WITH PROPOFOL;  Surgeon: Manya Silvas, MD;  Location: [place];  Service: Endoscopy;  Laterality: Left;       History of present illness and  Hospital Course:     Kindly see H&P for history of present illness and admission details, please review complete Labs, Consult reports and Test reports for all details in brief  HPI  from the history and physical done on the day of admission 54 yr old male with htn,GERD,admitted for severe anemia/SOB>Hb on admision 3.9.dmitted for severe symptomatic anemia.   Hospital Course; 1.Acute symptomatic anemia;started on IV protonix.received  4 units prbc transfusion total.hb  Up at 7.4 and hct 24.6. Had heme positive stool and iron deficiency anemia.seen by GI and had EGD on 10/28/EGD showed  normal esophagus,normal stomach ,normal duodenum, ,mild Schatzki ring,pt advised to have  Out pt capsule endoscopy.  2,SOB;seen by pulmonary,had h/o copd;conmtinued on  inhalers SOb due to anemia;lasix given with transfusion,  3.HYponatremia;beer potomania;h/o heavy alcoholis.sodium levels fluctuated between 119 to 125.on the day d/c  Sodium is 131.,  Discharge Condition: stable.   Follow UP  Follow-up Information    Follow up with Gaylyn Cheers, MD. Schedule an appointment as soon as possible for a visit in 1 week.   Specialty:  Gastroenterology   Contact information:   [place] [address] [phone]       Please follow up.   Why:  please call Dr. Percell [place] office Monday am to schedule an appointment         Discharge Instructions  and  Discharge Medications       Medication List    TAKE these medications        albuterol 108 (90 BASE) MCG/ACT inhaler  Commonly known as:  PROVENTIL HFA;VENTOLIN HFA  Inhale 2 puffs into the lungs every 6 (six) hours as needed for wheezing or shortness of breath.     amLODipine 10 MG tablet  Commonly known as:  NORVASC  Take 10 mg by mouth daily.     lisinopril 40 MG tablet  Commonly known as:  PRINIVIL,ZESTRIL  Take 40 mg by mouth daily.     omeprazole 40 MG capsule  Commonly known as:  PRILOSEC  Take 40 mg by mouth daily.     tadalafil 10 MG tablet  Commonly known as:  CIALIS  Take 10 mg by mouth daily as needed for erectile dysfunction.          Diet and Activity recommendation: See Discharge Instructions above   Consults obtained - GI   Major procedures and Radiology Reports - PLEASE  review detailed and final reports for all details, in brief -      Dg Chest Port 1 View  11/21/2014  CLINICAL DATA:  Dyspnea EXAM: PORTABLE CHEST 1 VIEW COMPARISON:  06/05/2014 FINDINGS: Borderline cardiomegaly. Negative aortic and hilar contours. There is diffuse interstitial coarsening with small pleural effusions. Cephalized blood flow. No asymmetric consolidation. Lower thoracic and lumbar posterior fixation. Remote right  seventh rib fracture. IMPRESSION: CHF pattern. Electronically Signed   By: Monte Fantasia M.D.   On: 11/21/2014 11:42    Micro Results   No results found for this or any previous visit (from the past 240 hour(s)).     Today   Subjective:   Steven Davis today has no headache,no chest abdominal pain,no new weakness tingling or numbness, feels much better wants to go home today.   Objective:   Blood pressure 113/73, pulse 95, temperature 97.9 F (36.6 C), temperature source Oral, resp. rate 20, height '5\' 1"'$  (1.549 m), weight 68.266 kg (150 lb 8 oz), SpO2 97 %.  No intake or output data in the 24 hours ending 11/26/14 2214  Exam Awake Alert, Oriented x 3, No new F.N deficits, Normal affect [place].AT,PERRAL Supple Neck,No JVD, No cervical lymphadenopathy appriciated.  Symmetrical Chest wall movement, Good air movement bilaterally, CTAB RRR,No Gallops,Rubs or new Murmurs, No Parasternal Heave +ve B.Sounds, Abd Soft, Non tender, No organomegaly appriciated, No rebound -guarding or rigidity. No Cyanosis, Clubbing or edema, No new Rash or bruise  Data Review   CBC w Diff:  Lab Results  Component Value Date   WBC 11.7* 11/23/2014   HGB 7.4* 11/23/2014   HCT 24.6* 11/23/2014   PLT 499* 11/23/2014   LYMPHOPCT 13 11/21/2014   BANDSPCT 0 11/21/2014   MONOPCT 7 11/21/2014   EOSPCT 3 11/21/2014   BASOPCT 3 11/21/2014    CMP:  Lab Results  Component Value Date   NA 131* 11/23/2014   K 3.4* 11/23/2014   CL 98* 11/23/2014   CO2 22 11/23/2014   BUN 13 11/23/2014   CREATININE 0.76 11/23/2014   PROT 6.6 11/22/2014   ALBUMIN 3.5 11/22/2014   BILITOT 1.2 11/22/2014   ALKPHOS 75 11/22/2014   AST 20 11/22/2014   ALT 9* 11/22/2014  .   Total Time in preparing paper work, data evaluation and todays exam - 65 minutes  Janiyla Long M.D on 11/26/2014 at 10:14 PM    Note: This dictation was prepared with Dragon dictation along with smaller phrase technology. Any  transcriptional errors that result from this process are unintentional.

## 2016-10-29 ENCOUNTER — Encounter: Payer: Self-pay | Admitting: Emergency Medicine

## 2016-10-29 ENCOUNTER — Emergency Department
Admission: EM | Admit: 2016-10-29 | Discharge: 2016-10-29 | Disposition: A | Discharge status: Home / Self Care | Payer: Medicare Other | Attending: Emergency Medicine | Admitting: Emergency Medicine

## 2016-10-29 DIAGNOSIS — I10 Essential (primary) hypertension: Secondary | ICD-10-CM | POA: Diagnosis not present

## 2016-10-29 DIAGNOSIS — J0100 Acute maxillary sinusitis, unspecified: Secondary | ICD-10-CM | POA: Insufficient documentation

## 2016-10-29 DIAGNOSIS — Z79899 Other long term (current) drug therapy: Secondary | ICD-10-CM | POA: Insufficient documentation

## 2016-10-29 DIAGNOSIS — F17210 Nicotine dependence, cigarettes, uncomplicated: Secondary | ICD-10-CM | POA: Insufficient documentation

## 2016-10-29 DIAGNOSIS — R51 Headache: Secondary | ICD-10-CM | POA: Diagnosis present

## 2016-10-29 HISTORY — DX: Anemia, unspecified: D64.9

## 2016-10-29 LAB — CBC WITH DIFFERENTIAL/PLATELET
BASOS ABS: 0.1 10*3/uL (ref 0–0.1)
BASOS PCT: 1 %
EOS PCT: 8 %
Eosinophils Absolute: 0.7 10*3/uL (ref 0–0.7)
HCT: 44.5 % (ref 40.0–52.0)
Hemoglobin: 15.8 g/dL (ref 13.0–18.0)
LYMPHS PCT: 10 %
Lymphs Abs: 0.9 10*3/uL — ABNORMAL LOW (ref 1.0–3.6)
MCH: 34.6 pg — ABNORMAL HIGH (ref 26.0–34.0)
MCHC: 35.6 g/dL (ref 32.0–36.0)
MCV: 97.2 fL (ref 80.0–100.0)
Monocytes Absolute: 1.1 10*3/uL — ABNORMAL HIGH (ref 0.2–1.0)
Monocytes Relative: 12 %
Neutro Abs: 6.3 10*3/uL (ref 1.4–6.5)
Neutrophils Relative %: 69 %
PLATELETS: 318 10*3/uL (ref 150–440)
RBC: 4.58 MIL/uL (ref 4.40–5.90)
RDW: 14.9 % — ABNORMAL HIGH (ref 11.5–14.5)
WBC: 9.1 10*3/uL (ref 3.8–10.6)

## 2016-10-29 LAB — TROPONIN I

## 2016-10-29 LAB — COMPREHENSIVE METABOLIC PANEL
ALBUMIN: 3.7 g/dL (ref 3.5–5.0)
ALT: 7 U/L — ABNORMAL LOW (ref 17–63)
ANION GAP: 12 (ref 5–15)
AST: 20 U/L (ref 15–41)
Alkaline Phosphatase: 95 U/L (ref 38–126)
BUN: 8 mg/dL (ref 6–20)
CALCIUM: 9.3 mg/dL (ref 8.9–10.3)
CO2: 24 mmol/L (ref 22–32)
Chloride: 87 mmol/L — ABNORMAL LOW (ref 101–111)
Creatinine, Ser: 0.48 mg/dL — ABNORMAL LOW (ref 0.61–1.24)
GLUCOSE: 84 mg/dL (ref 65–99)
POTASSIUM: 3.4 mmol/L — AB (ref 3.5–5.1)
SODIUM: 123 mmol/L — AB (ref 135–145)
TOTAL PROTEIN: 7.9 g/dL (ref 6.5–8.1)
Total Bilirubin: 0.4 mg/dL (ref 0.3–1.2)

## 2016-10-29 MED ORDER — IBUPROFEN 600 MG PO TABS
ORAL_TABLET | ORAL | Status: AC
  Administered 2016-10-29: 600 mg via ORAL
  Filled 2016-10-29: qty 1

## 2016-10-29 MED ORDER — PSEUDOEPHEDRINE HCL 30 MG PO TABS
30.0000 mg | ORAL_TABLET | Freq: Once | ORAL | Status: AC
  Administered 2016-10-29: 30 mg via ORAL
  Filled 2016-10-29: qty 1

## 2016-10-29 MED ORDER — ACETAMINOPHEN 500 MG PO TABS
ORAL_TABLET | ORAL | Status: AC
  Administered 2016-10-29: 1000 mg via ORAL
  Filled 2016-10-29: qty 2

## 2016-10-29 MED ORDER — IBUPROFEN 400 MG PO TABS
600.0000 mg | ORAL_TABLET | Freq: Once | ORAL | Status: AC
  Administered 2016-10-29: 600 mg via ORAL

## 2016-10-29 MED ORDER — AMOXICILLIN-POT CLAVULANATE 875-125 MG PO TABS: 1.0000 | ORAL_TABLET | Freq: Two times a day (BID) | ORAL | 0 refills | Status: AC

## 2016-10-29 MED ORDER — ACETAMINOPHEN 500 MG PO TABS
1000.0000 mg | ORAL_TABLET | Freq: Once | ORAL | Status: AC
  Administered 2016-10-29: 1000 mg via ORAL

## 2016-10-29 MED ORDER — OXYMETAZOLINE HCL 0.05 % NA SOLN
1.0000 | Freq: Once | NASAL | Status: AC
Start: 2016-10-29 — End: 2016-10-29
  Administered 2016-10-29: 1 via NASAL
  Filled 2016-10-29: qty 15

## 2016-10-29 MED ORDER — OXYMETAZOLINE HCL 0.05 % NA SOLN
NASAL | Status: AC
  Administered 2016-10-29: 1 via NASAL
  Filled 2016-10-29: qty 15

## 2016-10-29 MED ORDER — SODIUM CHLORIDE 0.9 % IV BOLUS (SEPSIS)
1000.0000 mL | Freq: Once | INTRAVENOUS | Status: AC
  Administered 2016-10-29: 1000 mL via INTRAVENOUS

## 2016-10-29 NOTE — ED Provider Notes (Signed)
[facility] Emergency Department Provider Note  ____________________________________________   First MD Initiated Contact with Patient 10/29/16 1909     (approximate)  I have reviewed the triage vital signs and the nursing notes.   HISTORY  Chief Complaint Headache    HPI Steven Davis is a 56 y.o. male who self presents emergency Department with roughly 7-8 days of gradual onset moderate severity left sided facial pressure-like congestion and headache. His pain is moderate at worse when lying flat somewhat improved with sitting up. He's had increasing discharge from bilateral nares. He's had no fevers or chills. Some dry cough. No neck pain. No photophobia. This feels like similar sinusitis episodes he's had in the past.   Past Medical History:  Diagnosis Date  . Anemia   . Erectile dysfunction   . GERD (gastroesophageal reflux disease)   . Hypertension     Patient Active Problem List   Diagnosis Date Noted  . Anemia 11/21/2014  . Acute encephalopathy 06/06/2014  . Hyponatremia 06/06/2014  . HTN (hypertension) 06/06/2014  . GERD (gastroesophageal reflux disease) 06/06/2014    Past Surgical History:  Procedure Laterality Date  . BACK SURGERY    . ESOPHAGOGASTRODUODENOSCOPY (EGD) WITH PROPOFOL Left 11/22/2014   Procedure: ESOPHAGOGASTRODUODENOSCOPY (EGD) WITH PROPOFOL;  Surgeon: Manya Silvas, MD;  Location: [facility] ENDOSCOPY;  Service: Endoscopy;  Laterality: Left;    Prior to Admission medications   Medication Sig Start Date End Date Taking? Authorizing Provider  albuterol (PROVENTIL HFA;VENTOLIN HFA) 108 (90 BASE) MCG/ACT inhaler Inhale 2 puffs into the lungs every 6 (six) hours as needed for wheezing or shortness of breath.    [provider]  amLODipine (NORVASC) 10 MG tablet Take 10 mg by mouth daily.    [provider]  amoxicillin-clavulanate (AUGMENTIN) 875-125 MG tablet Take 1 tablet by mouth 2 (two) times daily.  10/29/16 11/03/16  Darel Hong, MD  lisinopril (PRINIVIL,ZESTRIL) 40 MG tablet Take 40 mg by mouth daily.    [provider]  omeprazole (PRILOSEC) 40 MG capsule Take 40 mg by mouth daily.    [provider]  tadalafil (CIALIS) 10 MG tablet Take 10 mg by mouth daily as needed for erectile dysfunction.    [provider]    Allergies Benadryl [diphenhydramine hcl (sleep)]  Family History  Problem Relation Age of Onset  . Hypertension Father     Social History Social History  Substance Use Topics  . Smoking status: Current Every Day Smoker    Packs/day: 1.00    Types: Cigarettes  . Smokeless tobacco: Never Used  . Alcohol use 1.2 - 1.8 oz/week    2 - 3 Cans of beer per week    Review of Systems Constitutional: No fever/chills Eyes: No visual changes. ENT: No sore throat. Cardiovascular: Denies chest pain. Respiratory: Denies shortness of breath. Gastrointestinal: No abdominal pain.  No nausea, no vomiting.  No diarrhea.  No constipation. Genitourinary: Negative for dysuria. Musculoskeletal: Negative for back pain. Skin: Negative for rash. Neurological: Positive for headache   ____________________________________________   PHYSICAL EXAM:  VITAL SIGNS: ED Triage Vitals  Enc Vitals Group     BP 10/29/16 1812 (!) 176/103     Pulse Rate 10/29/16 1812 81     Resp 10/29/16 1812 16     Temp 10/29/16 1812 98.4 F (36.9 C)     Temp Source 10/29/16 1812 Oral     SpO2 10/29/16 1812 97 %     Weight 10/29/16 1813 140  lb (63.5 kg)     Height 10/29/16 1813 5' (1.524 m)     Head Circumference --      Peak Flow --      Pain Score 10/29/16 1812 5     Pain Loc --      Pain Edu? --      Excl. in [place]? --     Constitutional: Alert and oriented 4 appears somewhat uncomfortable but pleasant cooperative speaks in full clear sentences no diaphoresis Eyes: PERRL EOMI. Head: Atraumatic. Somewhat tender over left maxillary sinus Nose: No  congestion/rhinnorhea. Mouth/Throat: No trismus Neck: No stridor.  No meningismus Cardiovascular: Normal rate, regular rhythm. Grossly normal heart sounds.  Good peripheral circulation. Respiratory: Normal respiratory effort.  No retractions. Lungs CTAB and moving good air Gastrointestinal:  Musculoskeletal: No lower extremity edema   Neurologic:  Normal speech and language. No gross focal neurologic deficits are appreciated. Skin:  Skin is warm, dry and intact. No rash noted. Psychiatric: Mood and affect are normal. Speech and behavior are normal.    ____________________________________________   DIFFERENTIAL includes but not limited to  Sinus congestion, sinusitis, tension headache, upper respiratory tract infection ____________________________________________   LABS (all labs ordered are listed, but only abnormal results are displayed)  Labs Reviewed  CBC WITH DIFFERENTIAL/PLATELET - Abnormal; Notable for the following:       Result Value   MCH 34.6 (*)    RDW 14.9 (*)    Lymphs Abs 0.9 (*)    Monocytes Absolute 1.1 (*)    All other components within normal limits  COMPREHENSIVE METABOLIC PANEL - Abnormal; Notable for the following:    Sodium 123 (*)    Potassium 3.4 (*)    Chloride 87 (*)    Creatinine, Ser 0.48 (*)    ALT 7 (*)    All other components within normal limits  TROPONIN I    Blood work reviewed and interpreted by me shows hypochloremic hyponatremia consistent with dehydration __________________________________________  EKG  ED ECG REPORT I, Darel Hong, the attending physician, personally viewed and interpreted this ECG.  Date: 10/29/2016 EKG Time:  Rate: 80 Rhythm: normal sinus rhythm QRS Axis: normal Intervals: normal ST/T Wave abnormalities: normal Narrative Interpretation: no evidence of acute  ischemia  ____________________________________________  RADIOLOGY   ____________________________________________   PROCEDURES  Procedure(s) performed: no  Procedures  Critical Care performed: no  Observation: no ____________________________________________   INITIAL IMPRESSION / ASSESSMENT AND PLAN / ED COURSE  Pertinent labs & imaging results that were available during my care of the patient were reviewed by me and considered in my medical decision making (see chart for details).       The patient is hemodynamically stable and well appearing. He states he has chronic hyponatremia and 123 is roughly at his baseline. He does have hypochloremia today suggestive of some dehydration and he consents to 1 L of fluid. Regarding his sinus congestion he said symptoms for more than 7 days and as is nearing the ten-day Elta [place] F think it is reasonable to treat him with a short course of antibiotics. He understands that decongestants and saline rinses or more effective and he must continue these as well.  After 1 L of fluid the patient feels remarkably improved and would like to go home. He is discharged home with Afrin, nasal rinse, in 5 days of Augmentin. Strict return precautions given. ____________________________________________   FINAL CLINICAL IMPRESSION(S) / ED DIAGNOSES  Final diagnoses:  Acute non-recurrent maxillary sinusitis  NEW MEDICATIONS STARTED DURING THIS VISIT:  Discharge Medication List as of 10/29/2016  8:45 PM    START taking these medications   Details  amoxicillin-clavulanate (AUGMENTIN) 875-125 MG tablet Take 1 tablet by mouth 2 (two) times daily., Starting Fri 10/29/2016, Until Wed 11/03/2016, Print         Note:  This document was prepared using Dragon voice recognition software and may include unintentional dictation errors.     Darel Hong, MD 10/30/16 1442

## 2016-10-29 NOTE — Discharge Instructions (Signed)
Please take all of your antibiotics as prescribed and make sure you buy a NETI POT to help flush out your sinuses.  Make an appointment to follow-up to primary care physician this coming Monday and return to the emergency department sooner for any concerns whatsoever.  It was a pleasure to take care of you today, and thank you for coming to our emergency department.  If you have any questions or concerns before leaving please ask the nurse to grab me and I'm more than happy to go through your aftercare instructions again.  If you were prescribed any opioid pain medication today such as Norco, Vicodin, Percocet, morphine, hydrocodone, or oxycodone please make sure you do not drive when you are taking this medication as it can alter your ability to drive safely.  If you have any concerns once you are home that you are not improving or are in fact getting worse before you can make it to your follow-up appointment, please do not hesitate to call 911 and come back for further evaluation.  Darel Hong, MD  Results for orders placed or performed during the hospital encounter of 10/29/16  CBC with Differential  Result Value Ref Range   WBC 9.1 3.8 - 10.6 K/uL   RBC 4.58 4.40 - 5.90 MIL/uL   Hemoglobin 15.8 13.0 - 18.0 g/dL   HCT 44.5 40.0 - 52.0 %   MCV 97.2 80.0 - 100.0 fL   MCH 34.6 (H) 26.0 - 34.0 pg   MCHC 35.6 32.0 - 36.0 g/dL   RDW 14.9 (H) 11.5 - 14.5 %   Platelets 318 150 - 440 K/uL   Neutrophils Relative % 69 %   Neutro Abs 6.3 1.4 - 6.5 K/uL   Lymphocytes Relative 10 %   Lymphs Abs 0.9 (L) 1.0 - 3.6 K/uL   Monocytes Relative 12 %   Monocytes Absolute 1.1 (H) 0.2 - 1.0 K/uL   Eosinophils Relative 8 %   Eosinophils Absolute 0.7 0 - 0.7 K/uL   Basophils Relative 1 %   Basophils Absolute 0.1 0 - 0.1 K/uL  Troponin I  Result Value Ref Range   Troponin I <0.03 <0.03 ng/mL  Comprehensive metabolic panel  Result Value Ref Range   Sodium 123 (L) 135 - 145 mmol/L   Potassium 3.4  (L) 3.5 - 5.1 mmol/L   Chloride 87 (L) 101 - 111 mmol/L   CO2 24 22 - 32 mmol/L   Glucose, Bld 84 65 - 99 mg/dL   BUN 8 6 - 20 mg/dL   Creatinine, Ser 0.48 (L) 0.61 - 1.24 mg/dL   Calcium 9.3 8.9 - 10.3 mg/dL   Total Protein 7.9 6.5 - 8.1 g/dL   Albumin 3.7 3.5 - 5.0 g/dL   AST 20 15 - 41 U/L   ALT 7 (L) 17 - 63 U/L   Alkaline Phosphatase 95 38 - 126 U/L   Total Bilirubin 0.4 0.3 - 1.2 mg/dL   GFR calc non Af Amer >60 >60 mL/min   GFR calc Af Amer >60 >60 mL/min   Anion gap 12 5 - 15

## 2016-10-29 NOTE — ED Triage Notes (Signed)
Pt to the ED via POV c/o headache and not feeling well. Pt states that the past few days he has had pain on the left side of his face that comes and goes, starts out behind his eye and moves down into his cheek and jaw. Pt also states that he has had more shortness of breath than normal and has also had some "tightness" in the chest.

## 2016-11-15 ENCOUNTER — Other Ambulatory Visit: Payer: Self-pay | Admitting: Family Medicine

## 2016-11-15 DIAGNOSIS — R911 Solitary pulmonary nodule: Secondary | ICD-10-CM

## 2016-11-23 ENCOUNTER — Ambulatory Visit
Admission: RE | Admit: 2016-11-23 | Discharge: 2016-11-23 | Disposition: A | Discharge status: Home / Self Care | Payer: Medicare Other | Source: Ambulatory Visit | Attending: Family Medicine | Admitting: Family Medicine

## 2016-11-23 DIAGNOSIS — I2510 Atherosclerotic heart disease of native coronary artery without angina pectoris: Secondary | ICD-10-CM | POA: Diagnosis not present

## 2016-11-23 DIAGNOSIS — R911 Solitary pulmonary nodule: Secondary | ICD-10-CM

## 2016-11-23 DIAGNOSIS — K838 Other specified diseases of biliary tract: Secondary | ICD-10-CM | POA: Diagnosis not present

## 2016-11-23 DIAGNOSIS — R918 Other nonspecific abnormal finding of lung field: Secondary | ICD-10-CM | POA: Diagnosis not present

## 2016-11-23 DIAGNOSIS — I700 Atherosclerosis of aorta: Secondary | ICD-10-CM | POA: Insufficient documentation

## 2016-11-23 MED ORDER — IOPAMIDOL (ISOVUE-300) INJECTION 61%
75.0000 mL | Freq: Once | INTRAVENOUS | Status: AC | PRN
  Administered 2016-11-23: 75 mL via INTRAVENOUS

## 2016-12-03 ENCOUNTER — Emergency Department: Payer: Medicare Other

## 2016-12-03 ENCOUNTER — Other Ambulatory Visit: Payer: Self-pay

## 2016-12-03 ENCOUNTER — Inpatient Hospital Stay
Admission: EM | Admit: 2016-12-03 | Discharge: 2016-12-07 | DRG: 987 | Disposition: A | Discharge status: Home / Self Care | Payer: Medicare Other | Attending: Internal Medicine | Admitting: Internal Medicine

## 2016-12-03 ENCOUNTER — Encounter: Payer: Self-pay | Admitting: Emergency Medicine

## 2016-12-03 DIAGNOSIS — Z8249 Family history of ischemic heart disease and other diseases of the circulatory system: Secondary | ICD-10-CM | POA: Diagnosis not present

## 2016-12-03 DIAGNOSIS — Z79899 Other long term (current) drug therapy: Secondary | ICD-10-CM | POA: Diagnosis not present

## 2016-12-03 DIAGNOSIS — R590 Localized enlarged lymph nodes: Secondary | ICD-10-CM | POA: Diagnosis not present

## 2016-12-03 DIAGNOSIS — K219 Gastro-esophageal reflux disease without esophagitis: Secondary | ICD-10-CM | POA: Diagnosis present

## 2016-12-03 DIAGNOSIS — R490 Dysphonia: Secondary | ICD-10-CM | POA: Diagnosis not present

## 2016-12-03 DIAGNOSIS — R0602 Shortness of breath: Secondary | ICD-10-CM | POA: Diagnosis not present

## 2016-12-03 DIAGNOSIS — C3492 Malignant neoplasm of unspecified part of left bronchus or lung: Principal | ICD-10-CM | POA: Diagnosis present

## 2016-12-03 DIAGNOSIS — R05 Cough: Secondary | ICD-10-CM | POA: Diagnosis not present

## 2016-12-03 DIAGNOSIS — C787 Secondary malignant neoplasm of liver and intrahepatic bile duct: Secondary | ICD-10-CM | POA: Diagnosis present

## 2016-12-03 DIAGNOSIS — Z888 Allergy status to other drugs, medicaments and biological substances status: Secondary | ICD-10-CM

## 2016-12-03 DIAGNOSIS — J440 Chronic obstructive pulmonary disease with acute lower respiratory infection: Secondary | ICD-10-CM | POA: Diagnosis present

## 2016-12-03 DIAGNOSIS — J9601 Acute respiratory failure with hypoxia: Secondary | ICD-10-CM | POA: Diagnosis present

## 2016-12-03 DIAGNOSIS — R0600 Dyspnea, unspecified: Secondary | ICD-10-CM

## 2016-12-03 DIAGNOSIS — R0902 Hypoxemia: Secondary | ICD-10-CM

## 2016-12-03 DIAGNOSIS — Z23 Encounter for immunization: Secondary | ICD-10-CM

## 2016-12-03 DIAGNOSIS — F17210 Nicotine dependence, cigarettes, uncomplicated: Secondary | ICD-10-CM | POA: Diagnosis present

## 2016-12-03 DIAGNOSIS — J441 Chronic obstructive pulmonary disease with (acute) exacerbation: Secondary | ICD-10-CM | POA: Diagnosis not present

## 2016-12-03 DIAGNOSIS — R918 Other nonspecific abnormal finding of lung field: Secondary | ICD-10-CM

## 2016-12-03 DIAGNOSIS — I110 Hypertensive heart disease with heart failure: Secondary | ICD-10-CM | POA: Diagnosis present

## 2016-12-03 DIAGNOSIS — J189 Pneumonia, unspecified organism: Secondary | ICD-10-CM | POA: Diagnosis present

## 2016-12-03 DIAGNOSIS — I509 Heart failure, unspecified: Secondary | ICD-10-CM | POA: Diagnosis present

## 2016-12-03 DIAGNOSIS — J9600 Acute respiratory failure, unspecified whether with hypoxia or hypercapnia: Secondary | ICD-10-CM | POA: Diagnosis not present

## 2016-12-03 DIAGNOSIS — I10 Essential (primary) hypertension: Secondary | ICD-10-CM | POA: Diagnosis not present

## 2016-12-03 LAB — BASIC METABOLIC PANEL
Anion gap: 15 (ref 5–15)
BUN: 14 mg/dL (ref 6–20)
CHLORIDE: 93 mmol/L — AB (ref 101–111)
CO2: 27 mmol/L (ref 22–32)
Calcium: 8.9 mg/dL (ref 8.9–10.3)
Creatinine, Ser: 0.54 mg/dL — ABNORMAL LOW (ref 0.61–1.24)
GFR calc Af Amer: >60 mL/min (ref >60)
GFR calc non Af Amer: >60 mL/min (ref >60)
GLUCOSE: 107 mg/dL — AB (ref 65–99)
POTASSIUM: 3.9 mmol/L (ref 3.5–5.1)
Sodium: 135 mmol/L (ref 135–145)

## 2016-12-03 LAB — CBC
HCT: 40.4 % (ref 40.0–52.0)
HEMOGLOBIN: 13.5 g/dL (ref 13.0–18.0)
MCH: 32 pg (ref 26.0–34.0)
MCHC: 33.4 g/dL (ref 32.0–36.0)
MCV: 95.8 fL (ref 80.0–100.0)
Platelets: 351 10*3/uL (ref 150–440)
RBC: 4.22 MIL/uL — AB (ref 4.40–5.90)
RDW: 16 % — ABNORMAL HIGH (ref 11.5–14.5)
WBC: 7.8 10*3/uL (ref 3.8–10.6)

## 2016-12-03 LAB — MRSA PCR SCREENING: MRSA BY PCR: NEGATIVE

## 2016-12-03 LAB — TROPONIN I: Troponin I: 0.04 ng/mL (ref <0.03)

## 2016-12-03 MED ORDER — LEVOFLOXACIN IN D5W 750 MG/150ML IV SOLN
750.0000 mg | Freq: Once | INTRAVENOUS | Status: AC
  Administered 2016-12-03: 750 mg via INTRAVENOUS
  Filled 2016-12-03: qty 150

## 2016-12-03 MED ORDER — IPRATROPIUM-ALBUTEROL 0.5-2.5 (3) MG/3ML IN SOLN
3.0000 mL | Freq: Four times a day (QID) | RESPIRATORY_TRACT | Status: DC
  Administered 2016-12-03 – 2016-12-04 (×4): 3 mL via RESPIRATORY_TRACT
  Filled 2016-12-03 (×5): qty 3

## 2016-12-03 MED ORDER — AMLODIPINE BESYLATE 10 MG PO TABS
10.0000 mg | ORAL_TABLET | Freq: Every day | ORAL | Status: DC
  Administered 2016-12-03 – 2016-12-07 (×5): 10 mg via ORAL
  Filled 2016-12-03 (×6): qty 1

## 2016-12-03 MED ORDER — LISINOPRIL 20 MG PO TABS
40.0000 mg | ORAL_TABLET | Freq: Every day | ORAL | Status: DC
  Administered 2016-12-03 – 2016-12-07 (×5): 40 mg via ORAL
  Filled 2016-12-03 (×6): qty 2

## 2016-12-03 MED ORDER — ACETAMINOPHEN 325 MG PO TABS
650.0000 mg | ORAL_TABLET | Freq: Four times a day (QID) | ORAL | Status: DC | PRN
  Administered 2016-12-06: 19:00:00 650 mg via ORAL
  Filled 2016-12-03: qty 2

## 2016-12-03 MED ORDER — PNEUMOCOCCAL VAC POLYVALENT 25 MCG/0.5ML IJ INJ
0.5000 mL | INJECTION | INTRAMUSCULAR | Status: AC
  Administered 2016-12-04: 0.5 mL via INTRAMUSCULAR
  Filled 2016-12-03: qty 0.5

## 2016-12-03 MED ORDER — FERROUS SULFATE 325 (65 FE) MG PO TABS
325.0000 mg | ORAL_TABLET | Freq: Every day | ORAL | Status: DC
  Administered 2016-12-03 – 2016-12-07 (×5): 325 mg via ORAL
  Filled 2016-12-03 (×5): qty 1

## 2016-12-03 MED ORDER — VANCOMYCIN HCL IN DEXTROSE 1-5 GM/200ML-% IV SOLN: 1000.0000 mg | Freq: Once | INTRAVENOUS | Status: DC

## 2016-12-03 MED ORDER — PIPERACILLIN-TAZOBACTAM 3.375 G IVPB 30 MIN
3.3750 g | Freq: Once | INTRAVENOUS | Status: AC
  Administered 2016-12-03: 14:00:00 3.375 g via INTRAVENOUS
  Filled 2016-12-03: qty 50

## 2016-12-03 MED ORDER — OXYCODONE HCL ER 10 MG PO T12A
10.0000 mg | EXTENDED_RELEASE_TABLET | Freq: Two times a day (BID) | ORAL | Status: DC
  Administered 2016-12-03 – 2016-12-07 (×9): 10 mg via ORAL
  Filled 2016-12-03 (×9): qty 1

## 2016-12-03 MED ORDER — VANCOMYCIN HCL IN DEXTROSE 1-5 GM/200ML-% IV SOLN
1000.0000 mg | Freq: Once | INTRAVENOUS | Status: AC
  Administered 2016-12-03: 17:00:00 1000 mg via INTRAVENOUS
  Filled 2016-12-03: qty 200

## 2016-12-03 MED ORDER — ONDANSETRON HCL 4 MG/2ML IJ SOLN
4.0000 mg | Freq: Four times a day (QID) | INTRAMUSCULAR | Status: DC | PRN
Start: 2016-12-03 — End: 2016-12-07

## 2016-12-03 MED ORDER — NICOTINE 21 MG/24HR TD PT24
21.0000 mg | MEDICATED_PATCH | Freq: Every day | TRANSDERMAL | Status: DC
  Administered 2016-12-03 – 2016-12-07 (×5): 21 mg via TRANSDERMAL
  Filled 2016-12-03 (×5): qty 1

## 2016-12-03 MED ORDER — IPRATROPIUM-ALBUTEROL 0.5-2.5 (3) MG/3ML IN SOLN
3.0000 mL | Freq: Once | RESPIRATORY_TRACT | Status: AC
  Administered 2016-12-03: 3 mL via RESPIRATORY_TRACT
  Filled 2016-12-03: qty 3

## 2016-12-03 MED ORDER — ALBUTEROL SULFATE (2.5 MG/3ML) 0.083% IN NEBU: 3.0000 mL | INHALATION_SOLUTION | Freq: Four times a day (QID) | RESPIRATORY_TRACT | Status: DC | PRN

## 2016-12-03 MED ORDER — FUROSEMIDE 10 MG/ML IJ SOLN
60.0000 mg | Freq: Once | INTRAMUSCULAR | Status: AC
  Administered 2016-12-03: 60 mg via INTRAVENOUS
  Filled 2016-12-03: qty 8

## 2016-12-03 MED ORDER — ENOXAPARIN SODIUM 40 MG/0.4ML ~~LOC~~ SOLN
40.0000 mg | SUBCUTANEOUS | Status: DC
  Administered 2016-12-03 – 2016-12-04 (×2): 40 mg via SUBCUTANEOUS
  Filled 2016-12-03 (×2): qty 0.4

## 2016-12-03 MED ORDER — VANCOMYCIN HCL IN DEXTROSE 750-5 MG/150ML-% IV SOLN
750.0000 mg | Freq: Two times a day (BID) | INTRAVENOUS | Status: DC
  Administered 2016-12-03 – 2016-12-06 (×6): 750 mg via INTRAVENOUS
  Filled 2016-12-03 (×8): qty 150

## 2016-12-03 MED ORDER — ENSURE ENLIVE PO LIQD
237.0000 mL | Freq: Two times a day (BID) | ORAL | Status: DC
  Administered 2016-12-04 – 2016-12-07 (×3): 237 mL via ORAL

## 2016-12-03 MED ORDER — PANTOPRAZOLE SODIUM 40 MG PO TBEC
40.0000 mg | DELAYED_RELEASE_TABLET | Freq: Every day | ORAL | Status: DC
  Administered 2016-12-03 – 2016-12-07 (×5): 40 mg via ORAL
  Filled 2016-12-03 (×5): qty 1

## 2016-12-03 MED ORDER — ORAL CARE MOUTH RINSE
15.0000 mL | Freq: Two times a day (BID) | OROMUCOSAL | Status: DC
  Administered 2016-12-04 – 2016-12-07 (×6): 15 mL via OROMUCOSAL

## 2016-12-03 MED ORDER — DEXTROSE 5 % IV SOLN
1.0000 g | Freq: Three times a day (TID) | INTRAVENOUS | Status: DC
  Administered 2016-12-03 – 2016-12-07 (×12): 1 g via INTRAVENOUS
  Filled 2016-12-03 (×14): qty 1

## 2016-12-03 MED ORDER — DOCUSATE SODIUM 100 MG PO CAPS
100.0000 mg | ORAL_CAPSULE | Freq: Two times a day (BID) | ORAL | Status: DC | PRN
  Administered 2016-12-03 (×2): 100 mg via ORAL
  Filled 2016-12-03 (×2): qty 1

## 2016-12-03 NOTE — Plan of Care (Signed)
  Progressing Education: Knowledge of General Education information will improve 12/03/2016 1756 - Progressing by Cherylann Parr, RN Health Behavior/Discharge Planning: Ability to manage health-related needs will improve 12/03/2016 1756 - Progressing by Cherylann Parr, RN Nutrition: Adequate nutrition will be maintained 12/03/2016 1756 - Progressing by Cherylann Parr, RN Pain Managment: General experience of comfort will improve 12/03/2016 1756 - Progressing by Cherylann Parr, RN Safety: Ability to remain free from injury will improve 12/03/2016 1756 - Progressing by Cherylann Parr, RN Education: Knowledge of disease or condition will improve 12/03/2016 1756 - Progressing by Cherylann Parr, RN Knowledge of the prescribed therapeutic regimen will improve 12/03/2016 1756 - Progressing by Cherylann Parr, RN Respiratory: Ability to maintain a clear airway will improve 12/03/2016 1756 - Progressing by Cherylann Parr, RN Levels of oxygenation will improve 12/03/2016 1756 - Progressing by Cherylann Parr, RN Ability to maintain adequate ventilation will improve 12/03/2016 1756 - Progressing by Cherylann Parr, RN Complications related to the disease process, condition or treatment will be avoided or minimized 12/03/2016 1756 - Progressing by Cherylann Parr, RN

## 2016-12-03 NOTE — ED Provider Notes (Addendum)
[facility] [place] Emergency Department Provider Note  Time seen: 9:32 AM  I have reviewed the triage vital signs and the nursing notes.   HISTORY  Chief Complaint Shortness of Breath    HPI Steven Davis is a 56 y.o. male with a past medical history of hypertension, anemia, gastric reflux, COPD, left lung mass, presents to the emergency department for difficulty breathing.  According to the patient over the past 3 weeks he has been experiencing difficulty breathing.  Was seen at an emergency department several weeks ago had an abnormal CT scan showing a left lung mass per record review.  Patient was prescribed antibiotics at that time, states he was then prescribed antibiotics by his doctor.  He has finished both courses of antibiotics but continues to have shortness of breath.  Patient denies any oxygen use at baseline.  Room air saturation today is 73%.  Patient denies chest pain.  Denies fever.   Past Medical History:  Diagnosis Date  . Anemia   . Erectile dysfunction   . GERD (gastroesophageal reflux disease)   . Hypertension     Patient Active Problem List   Diagnosis Date Noted  . Anemia 11/21/2014  . Acute encephalopathy 06/06/2014  . Hyponatremia 06/06/2014  . HTN (hypertension) 06/06/2014  . GERD (gastroesophageal reflux disease) 06/06/2014    Past Surgical History:  Procedure Laterality Date  . BACK SURGERY      Prior to Admission medications   Medication Sig Start Date End Date Taking? Authorizing Provider  albuterol (PROVENTIL HFA;VENTOLIN HFA) 108 (90 BASE) MCG/ACT inhaler Inhale 2 puffs into the lungs every 6 (six) hours as needed for wheezing or shortness of breath.    [provider]  amLODipine (NORVASC) 10 MG tablet Take 10 mg by mouth daily.    [provider]  lisinopril (PRINIVIL,ZESTRIL) 40 MG tablet Take 40 mg by mouth daily.    [provider]  omeprazole (PRILOSEC) 40 MG capsule Take 40 mg by mouth  daily.    [provider]  tadalafil (CIALIS) 10 MG tablet Take 10 mg by mouth daily as needed for erectile dysfunction.    [provider]    Allergies  Allergen Reactions  . Benadryl [Diphenhydramine Hcl (Sleep)] Nausea And Vomiting    Family History  Problem Relation Age of Onset  . Hypertension Father     Social History Social History   Tobacco Use  . Smoking status: Current Every Day Smoker    Packs/day: 1.00    Types: Cigarettes  . Smokeless tobacco: Never Used  Substance Use Topics  . Alcohol use: Yes    Alcohol/week: 1.2 - 1.8 oz    Types: 2 - 3 Cans of beer per week  . Drug use: No    Review of Systems Constitutional: Negative for fever. ENT: Positive for congestion times 3 weeks Cardiovascular: Negative for chest pain. Respiratory: Positive for shortness of breath. Gastrointestinal: Negative for abdominal pain Musculoskeletal: Negative for leg pain or swelling Neurological: Negative for headache All other ROS negative  ____________________________________________   PHYSICAL EXAM:  VITAL SIGNS: ED Triage Vitals  Enc Vitals Group     BP 12/03/16 0929 (!) 159/96     Pulse Rate 12/03/16 0929 94     Resp 12/03/16 0929 (!) 22     Temp 12/03/16 0929 98.3 F (36.8 C)     Temp Source 12/03/16 0929 Oral     SpO2 12/03/16 0925 (!) 73 %     Weight  12/03/16 0930 132 lb (59.9 kg)     Height 12/03/16 0930 5\' 1"  (1.549 m)     Head Circumference --      Peak Flow --      Pain Score 12/03/16 0931 7     Pain Loc --      Pain Edu? --      Excl. in [place]? --    Constitutional: Alert and oriented.  Mild distress sitting upright in bed, difficulty breathing. Eyes: Normal exam ENT   Head: Normocephalic and atraumatic.   Mouth/Throat: Mucous membranes are moist. Cardiovascular: Normal rate, regular rhythm. No murmur Respiratory: Mild tachypnea, moderate expiratory wheeze bilaterally, mild rhonchi bilaterally. Gastrointestinal: Soft and  nontender. No distention.   Musculoskeletal: Nontender with normal range of motion in all extremities.  No lower extremity edema or tenderness. Neurologic:  Normal speech and language. No gross focal neurologic deficits  Skin:  Skin is warm, dry and intact.  Psychiatric: Mood and affect are normal.   ____________________________________________    EKG  EKG reviewed and interpreted by myself shows normal sinus rhythm at 91 bpm, narrow QRS, normal axis, normal intervals besides slight PR prolongation, nonspecific ST changes.  No ST elevation.  ____________________________________________    RADIOLOGY  X-ray shows perihilar airspace disease, edema versus pneumonia.  ____________________________________________   INITIAL IMPRESSION / ASSESSMENT AND PLAN / ED COURSE  Pertinent labs & imaging results that were available during my care of the patient were reviewed by me and considered in my medical decision making (see chart for details).  Patient presents to the emergency department for shortness of breath worsening over the past 3 weeks, room air saturation of 73%.  Differential would include COPD exacerbation, pneumonia, pneumothorax, CHF.  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  I reviewed the patient's records including recent [place] oncology visit.  Patient has been referred to [facility] thoracic oncology.  Currently patient overall appears well but mild respiratory distress sitting upright in bed.  Moderate wheeze with mild rhonchi bilaterally.  We will check labs obtain a chest x-ray and continue to closely monitor.  Patient received 2 breathing treatments and 125 mg of Solu-Medrol by EMS.  We will dose 2 additional duo nebs while awaiting x-ray and lab results.  Anticipate admission to the hospital given hypoxia with no baseline oxygen requirement.  X-ray shows perihilar disease edema versus pneumonia we will cover with antibiotics as well as dose Lasix.  Continue to await lab  results.  I have called lab regarding the results and they state they should be resulted in the next 10 minutes.  Patient's labs have resulted showing a slight troponin elevation 0.04.  Normal white blood cell count, normal chemistry.  Patient will be admitted to the hospital for pneumonia versus CHF exacerbation.  The patient's x-ray findings we will broaden coverage to cover for healthcare associated pneumonia. ____________________________________________   FINAL CLINICAL IMPRESSION(S) / ED DIAGNOSES  Dyspnea Hypoxia CHF Pneumonia    Harvest Dark, MD 12/03/16 1203    Harvest Dark, MD 12/03/16 1208

## 2016-12-03 NOTE — ED Triage Notes (Signed)
Pt brought in by [facility] from home for shortness of breath. 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 Pt states that x 6 weeks, worse over the past 3 weeks. Pt states that he could not take it anymore and that's why he came in today. EMS gave pt 2 duoneb treatments in route as well as 125 mg of Solumedrol. Pt O2 sats fround to be in the 70's on room air upon triage. Pt was placed on 6 liter O2 via [place] and Dr. Kerman Passey was paged to pts bedside.

## 2016-12-03 NOTE — Progress Notes (Signed)
Initial Nutrition Assessment  DOCUMENTATION CODES:   Not applicable  INTERVENTION:   Ensure Enlive po BID, each supplement provides 350 kcal and 20 grams of protein  NUTRITION DIAGNOSIS:   Increased nutrient needs related to acute illness as evidenced by estimated needs.  GOAL:   Patient will meet greater than or equal to 90% of their needs  MONITOR:   PO intake, Supplement acceptance, Labs, I & O's, Skin, Weight trends  REASON FOR ASSESSMENT:   Malnutrition Screening Tool     ASSESSMENT:   Pt with PMH of HTN, GERD, anemia, tobacco abuse, and recently diagnosed lung mass highly suspicious for bronchogenic carcinoma presents with SOB found HCAP   Spoke with pt at bedside, not very talkative. Pt reports a decline in weight over the past 6 weeks, reported UBW of 160 lbs. Pt is currently down to 130 lbs. Pt reports he has not consumed anything in 3 days, prior to this his intake has fluctuated.  Pt noted to have flushing, SOB, and tremors. RD recommend obtaining magnesium and phosphorus readings.  Labs reviewed; Creatinine 0.54 Medications reviewed; ferrous sulfate, Protonix, PRN Colace and Zofran  NUTRITION - FOCUSED PHYSICAL EXAM:    Most Recent Value  Orbital Region  No depletion  Upper Arm Region  No depletion  Thoracic and Lumbar Region  No depletion  Buccal Region  Mild depletion  Temple Region  Moderate depletion  Clavicle Bone Region  Moderate depletion  Clavicle and Acromion Bone Region  Moderate depletion  Scapular Bone Region  Unable to assess  Dorsal Hand  Mild depletion  Patellar Region  Unable to assess  Anterior Thigh Region  Unable to assess  Posterior Calf Region  Unable to assess  Edema (RD Assessment)  Moderate  Hair  Reviewed  Eyes  Reviewed  Mouth  Reviewed  Skin  Other (Comment) [flushing ]  Nails  Reviewed      Diet Order:  Diet 2 gram sodium Room service appropriate? Yes; Fluid consistency: Thin  EDUCATION NEEDS:   No education  needs have been identified at this time  Skin:  Skin Assessment: Reviewed RN Assessment  Last BM:  No BM Documented  Height:   Ht Readings from Last 1 Encounters:  12/03/16 5\' 3"  (1.6 m)    Weight:   Wt Readings from Last 1 Encounters:  12/03/16 130 lb 6 oz (59.1 kg)    Ideal Body Weight:  56 kg  BMI:  Body mass index is 23.09 kg/m.  Estimated Nutritional Needs:   Kcal:  1600-1800  Protein:  80-90 grams  Fluid:  >/= 1.6 L/d  Parks Ranger, MS, RDN, LDN 12/03/2016 4:59 PM

## 2016-12-03 NOTE — Progress Notes (Signed)
Pharmacy Antibiotic Note  Steven Davis is a 56 y.o. male admitted on 12/03/2016 with pneumonia.  Pharmacy has been consulted for vancomycin dosing.   Patient was recently on amoxicillin/clavulanic acid for pneumonia. Patient also has orders for cefepime 1 g IV q8h.  Plan: Vancomycin 1000 mg IV dose was not given in ED - will retime to be given now.  Will order vancomycin 750 mg IV q12h (6 hour stack) Goal VT 15-20 mcg/mL  Kinetics: Using actual body weight = 59 kg Ke: 0.075 Half-life: 9 hrs Vd: 41 L Cmin (estimated) ~ 15 mcg/mL  Will order MRSA PCR  Height: 5\' 3"  (160 cm) Weight: 130 lb 6 oz (59.1 kg) IBW/kg (Calculated) : 56.9  Temp (24hrs), Avg:98.9 F (37.2 C), Min:98.3 F (36.8 C), Max:99.5 F (37.5 C)  Recent Labs  Lab 12/03/16 0933  WBC 7.8  CREATININE 0.54*    Estimated Creatinine Clearance: 82 mL/min (A) (by C-G formula based on SCr of 0.54 mg/dL (L)).    Allergies  Allergen Reactions  . Benadryl [Diphenhydramine Hcl (Sleep)] Nausea And Vomiting    Antimicrobials this admission: cefepime 11/9 >>  vancomycin 11/9 >>   Dose adjustments this admission:  Microbiology results: 11/9 BCx: Sent 11/9 Sputum: Sent  11/9 MRSA PCR: Ordered  Thank you for allowing pharmacy to be a part of this patient's care.  Lenis Noon, PharmD, BCPS Clinical Pharmacist 12/03/2016 2:13 PM

## 2016-12-03 NOTE — H&P (Signed)
[place] at [place] NAME: Steven Davis    MR#:  [id number]  DATE OF BIRTH:  08-13-1960  DATE OF ADMISSION:  12/03/2016  PRIMARY CARE PHYSICIAN: [facility]   REQUESTING/REFERRING PHYSICIAN: Harvest Dark, MD  CHIEF COMPLAINT:   SOB HISTORY OF PRESENT ILLNESS:  Steven Davis  is a 56 y.o. male with a known history of hypertension,  GERD, COPD, recent diagnosis of left lung mass seen by [facility] oncology highly suspicious for bronchogenic carcinoma, is presenting to the ED with a chief complaint of shortness of breath.  Patient does not use any oxygen at home.  He was desaturated to 73% on room air today and he was placed on 5-6 L of oxygen in the emergency department.  Chest x-ray with bulky perihilar airspace disease due to pneumonia or edema.  Patient has used Augmentin 3 weeks ago for possible pneumonia.  Patient is started on Zosyn and vancomycin in the emergency department and hospitalist team was called to admit the patient  PAST MEDICAL HISTORY:   Past Medical History:  Diagnosis Date  . Anemia   . Erectile dysfunction   . GERD (gastroesophageal reflux disease)   . Hypertension     PAST SURGICAL HISTOIRY:   Past Surgical History:  Procedure Laterality Date  . BACK SURGERY      SOCIAL HISTORY:   Social History   Tobacco Use  . Smoking status: Current Every Day Smoker    Packs/day: 1.00    Types: Cigarettes  . Smokeless tobacco: Never Used  Substance Use Topics  . Alcohol use: Yes    Alcohol/week: 1.2 - 1.8 oz    Types: 2 - 3 Cans of beer per week    FAMILY HISTORY:   Family History  Problem Relation Age of Onset  . Hypertension Father     DRUG ALLERGIES:   Allergies  Allergen Reactions  . Benadryl [Diphenhydramine Hcl (Sleep)] Nausea And Vomiting    REVIEW OF SYSTEMS:  CONSTITUTIONAL: No fever, fatigue or weakness.  EYES: No blurred or double vision.  EARS, NOSE, AND THROAT: No  tinnitus or ear pain.  RESPIRATORY: reports cough, shortness of breath, no wheezing or hemoptysis.  CARDIOVASCULAR: No chest pain, orthopnea, edema.  GASTROINTESTINAL: No nausea, vomiting, diarrhea or abdominal pain.  GENITOURINARY: No dysuria, hematuria.  ENDOCRINE: No polyuria, nocturia,  HEMATOLOGY: No anemia, easy bruising or bleeding SKIN: No rash or lesion. MUSCULOSKELETAL: No joint pain or arthritis.   NEUROLOGIC: No tingling, numbness, weakness.  PSYCHIATRY: No anxiety or depression.   MEDICATIONS AT HOME:   Prior to Admission medications   Medication Sig Start Date End Date Taking? Authorizing Provider  amLODipine (NORVASC) 10 MG tablet Take 10 mg by mouth daily.   Yes [provider]  ferrous sulfate 325 (65 FE) MG tablet Take 325 mg daily by mouth.   Yes [provider]  lisinopril (PRINIVIL,ZESTRIL) 40 MG tablet Take 40 mg by mouth daily.   Yes [provider]  omeprazole (PRILOSEC) 40 MG capsule Take 40 mg by mouth daily.   Yes [provider]  Oxycodone HCl 10 MG TABS Take 10 mg 2 (two) times daily by mouth.   Yes [provider]  albuterol (PROVENTIL HFA;VENTOLIN HFA) 108 (90 BASE) MCG/ACT inhaler Inhale 2 puffs into the lungs every 6 (six) hours as needed for wheezing or shortness of breath.    [provider]  tadalafil (CIALIS) 10 MG tablet Take 10 mg  by mouth daily as needed for erectile dysfunction.    [provider]      VITAL SIGNS:  Blood pressure (!) 113/96, pulse 96, temperature 98.3 F (36.8 C), temperature source Oral, resp. rate (!) 24, height 5\' 1"  (1.549 m), weight 59.9 kg (132 lb), SpO2 96 %.  PHYSICAL EXAMINATION:  GENERAL:  56 y.o.-year-old patient lying in the bed with no acute distress.  EYES: Pupils equal, round, reactive to light and accommodation. No scleral icterus. Extraocular muscles intact.  HEENT: Head atraumatic, normocephalic. Oropharynx and nasopharynx clear.  NECK:  Supple,  no jugular venous distention. No thyroid enlargement, no tenderness.  LUNGS: Mod breath sounds bilaterally, no wheezing, rales,rhonchi , has crepitation. No use of accessory muscles of respiration.  CARDIOVASCULAR: S1, S2 normal. No murmurs, rubs, or gallops.  ABDOMEN: Soft, nontender, nondistended. Bowel sounds present. No organomegaly or mass.  EXTREMITIES: No pedal edema, cyanosis, or clubbing.  NEUROLOGIC: Cranial nerves II through XII are intact. Muscle strength 5/5 in all extremities. Sensation intact. Gait not checked.  PSYCHIATRIC: The patient is alert and oriented x 3.  SKIN: No obvious rash, lesion, or ulcer.   LABORATORY PANEL:   CBC Recent Labs  Lab 12/03/16 0933  WBC 7.8  HGB 13.5  HCT 40.4  PLT 351   ------------------------------------------------------------------------------------------------------------------  Chemistries  Recent Labs  Lab 12/03/16 0933  NA 135  K 3.9  CL 93*  CO2 27  GLUCOSE 107*  BUN 14  CREATININE 0.54*  CALCIUM 8.9   ------------------------------------------------------------------------------------------------------------------  Cardiac Enzymes Recent Labs  Lab 12/03/16 0933  TROPONINI 0.04*   ------------------------------------------------------------------------------------------------------------------  RADIOLOGY:  Dg Chest Portable 1 View  Result Date: 12/03/2016 CLINICAL DATA:  Worsening shortness of breath over the past 6 weeks in patient with a left lower lobe mass. EXAM: PORTABLE CHEST 1 VIEW COMPARISON:  CT chest 11/23/2016. Single-view of the chest with 11/21/2014. FINDINGS: Extensive perihilar airspace disease is present bilaterally. Bulky mediastinal lymphadenopathy is identified in the left lower lobe mass is noted but better visualized on prior CT. Heart size is normal. No pneumothorax or pleural effusion. IMPRESSION: Extensive perihilar airspace disease could be due to pneumonia or edema. Bulky mediastinal  lymphadenopathy and left lower lobe mass as seen on prior CT. Electronically Signed   By: Inge Rise M.D.   On: 12/03/2016 09:50    EKG:   Orders placed or performed during the hospital encounter of 10/29/16  . EKG 12-Lead  . EKG 12-Lead  . ED EKG  . ED EKG  . EKG    IMPRESSION AND PLAN:   Steven Davis  is a 56 y.o. male with a known history of hypertension,  GERD, COPD, recent diagnosis of left lung mass seen by [facility] oncology highly suspicious for bronchogenic carcinoma, is presenting to the ED with a chief complaint of shortness of breath.   #Acute hypoxemic respiratory failure secondary to healthcare associated pneumonia and underlying bronchogenic carcinoma Mid to MedSurg unit Broad spectrum IV antibiotics with cefepime and vancomycin Bronchodilator treatments Sputum culture and sensitivity Wean off oxygen as tolerated  #Essential hypertension Continue home medication Norvasc and lisinopril and titrate as needed  #History of left lung mass-bronchogenic carcinoma Outpatient follow-up with [facility] oncology  # GERD PPI   # Tobacco abuse disorder Counseled patient to quit smoking for 5 minutes.  He is agreeable with nicotine patch    All the records are reviewed and case discussed with ED provider. Management plans discussed with the patient, family and they are in  agreement.  CODE STATUS:   TOTAL TIME TAKING CARE OF THIS PATIENT: 43  minutes.   Note: This dictation was prepared with Dragon dictation along with smaller phrase technology. Any transcriptional errors that result from this process are unintentional.  Nicholes Mango M.D on 12/03/2016 at 1:42 PM  Between 7am to 6pm - Pager - [phone]  After 6pm go to [email or web address] - password [email or web address] [facility]  [place] [facility]  Office  [phone]  CC: Primary care physician; [facility]

## 2016-12-04 DIAGNOSIS — Z79899 Other long term (current) drug therapy: Secondary | ICD-10-CM

## 2016-12-04 DIAGNOSIS — R490 Dysphonia: Secondary | ICD-10-CM

## 2016-12-04 DIAGNOSIS — R0600 Dyspnea, unspecified: Secondary | ICD-10-CM

## 2016-12-04 DIAGNOSIS — R05 Cough: Secondary | ICD-10-CM

## 2016-12-04 DIAGNOSIS — I10 Essential (primary) hypertension: Secondary | ICD-10-CM

## 2016-12-04 DIAGNOSIS — R0602 Shortness of breath: Secondary | ICD-10-CM

## 2016-12-04 DIAGNOSIS — K219 Gastro-esophageal reflux disease without esophagitis: Secondary | ICD-10-CM

## 2016-12-04 DIAGNOSIS — R590 Localized enlarged lymph nodes: Secondary | ICD-10-CM

## 2016-12-04 DIAGNOSIS — F17210 Nicotine dependence, cigarettes, uncomplicated: Secondary | ICD-10-CM

## 2016-12-04 DIAGNOSIS — J189 Pneumonia, unspecified organism: Secondary | ICD-10-CM

## 2016-12-04 DIAGNOSIS — R918 Other nonspecific abnormal finding of lung field: Secondary | ICD-10-CM

## 2016-12-04 LAB — BLOOD CULTURE ID PANEL (REFLEXED)
Acinetobacter baumannii: NOT DETECTED
Acinetobacter baumannii: NOT DETECTED
CANDIDA ALBICANS: NOT DETECTED
CANDIDA GLABRATA: NOT DETECTED
CANDIDA GLABRATA: NOT DETECTED
CANDIDA KRUSEI: NOT DETECTED
CANDIDA PARAPSILOSIS: NOT DETECTED
CANDIDA TROPICALIS: NOT DETECTED
Candida albicans: NOT DETECTED
Candida krusei: NOT DETECTED
Candida parapsilosis: NOT DETECTED
Candida tropicalis: NOT DETECTED
ENTEROBACTER CLOACAE COMPLEX: NOT DETECTED
ENTEROBACTERIACEAE SPECIES: NOT DETECTED
ENTEROCOCCUS SPECIES: NOT DETECTED
ESCHERICHIA COLI: NOT DETECTED
Enterobacter cloacae complex: NOT DETECTED
Enterobacteriaceae species: NOT DETECTED
Enterococcus species: NOT DETECTED
Escherichia coli: NOT DETECTED
HAEMOPHILUS INFLUENZAE: NOT DETECTED
Haemophilus influenzae: NOT DETECTED
KLEBSIELLA OXYTOCA: NOT DETECTED
KLEBSIELLA PNEUMONIAE: NOT DETECTED
Klebsiella oxytoca: NOT DETECTED
Klebsiella pneumoniae: NOT DETECTED
LISTERIA MONOCYTOGENES: NOT DETECTED
Listeria monocytogenes: NOT DETECTED
METHICILLIN RESISTANCE: NOT DETECTED
Neisseria meningitidis: NOT DETECTED
Neisseria meningitidis: NOT DETECTED
PROTEUS SPECIES: NOT DETECTED
PSEUDOMONAS AERUGINOSA: NOT DETECTED
Proteus species: NOT DETECTED
Pseudomonas aeruginosa: NOT DETECTED
STREPTOCOCCUS PNEUMONIAE: NOT DETECTED
STREPTOCOCCUS PYOGENES: NOT DETECTED
STREPTOCOCCUS PYOGENES: NOT DETECTED
Serratia marcescens: NOT DETECTED
Serratia marcescens: NOT DETECTED
Staphylococcus aureus (BCID): NOT DETECTED
Staphylococcus aureus (BCID): NOT DETECTED
Staphylococcus species: DETECTED — AB
Staphylococcus species: NOT DETECTED
Streptococcus agalactiae: NOT DETECTED
Streptococcus agalactiae: NOT DETECTED
Streptococcus pneumoniae: NOT DETECTED
Streptococcus species: NOT DETECTED
Streptococcus species: NOT DETECTED

## 2016-12-04 LAB — CREATININE, SERUM: CREATININE: 0.39 mg/dL — AB (ref 0.61–1.24)

## 2016-12-04 LAB — HIV ANTIBODY (ROUTINE TESTING W REFLEX): HIV SCREEN 4TH GENERATION: NONREACTIVE

## 2016-12-04 LAB — MAGNESIUM: Magnesium: 1.6 mg/dL — ABNORMAL LOW (ref 1.7–2.4)

## 2016-12-04 LAB — PHOSPHORUS: PHOSPHORUS: 3.2 mg/dL (ref 2.5–4.6)

## 2016-12-04 MED ORDER — METHYLPREDNISOLONE SODIUM SUCC 125 MG IJ SOLR
60.0000 mg | Freq: Four times a day (QID) | INTRAMUSCULAR | Status: DC
  Administered 2016-12-04 – 2016-12-07 (×12): 60 mg via INTRAVENOUS
  Filled 2016-12-04 (×12): qty 2

## 2016-12-04 MED ORDER — IPRATROPIUM-ALBUTEROL 0.5-2.5 (3) MG/3ML IN SOLN
3.0000 mL | RESPIRATORY_TRACT | Status: DC
  Administered 2016-12-04 – 2016-12-07 (×14): 3 mL via RESPIRATORY_TRACT
  Filled 2016-12-04 (×15): qty 3

## 2016-12-04 MED ORDER — OXYCODONE HCL 5 MG PO TABS
5.0000 mg | ORAL_TABLET | Freq: Four times a day (QID) | ORAL | Status: DC | PRN
  Administered 2016-12-04 – 2016-12-07 (×10): 5 mg via ORAL
  Filled 2016-12-04 (×10): qty 1

## 2016-12-04 NOTE — Plan of Care (Signed)
  Pain Managment: General experience of comfort will improve 12/04/2016 0403 - Progressing by Evalette Montrose, Floyce Stakes, RN   Safety: Ability to remain free from injury will improve 12/04/2016 0403 - Progressing by Daja Shuping, Floyce Stakes, RN  Patient has remained free from injury. Respiratory: Ability to maintain a clear airway will improve 12/04/2016 0403 - Progressing by Anai Lipson, Floyce Stakes, RN  Patient breathing easier- continues to have dyspnea with exertion but it is improved since previous shift.

## 2016-12-04 NOTE — Progress Notes (Signed)
Pt is sating 90% on 6L [place]. Per orders, pt's goal is 92%. I called Dr. Anselm Jungling and he stated that 90% is an acceptable level for this pt. He stated he would also modify some of his medication orders.

## 2016-12-04 NOTE — Progress Notes (Signed)
[facility] Davis at [place] NAME: Steven Davis    MR#:  [id number]  DATE OF BIRTH:  1960-03-07  SUBJECTIVE:  CHIEF COMPLAINT:   Chief Complaint  Patient presents with  . Shortness of Breath     Very SOB, and In pain.   reepatedly treated with Abx for Respi infections in last 3 weeks. Finally CT chest showed a mass- likely cancer in lung, 2 days ago. REVIEW OF SYSTEMS:  CONSTITUTIONAL: No fever, fatigue or weakness.  EYES: No blurred or double vision.  EARS, NOSE, AND THROAT: No tinnitus or ear pain.  RESPIRATORY: positive for cough, shortness of breath, no wheezing or hemoptysis.  CARDIOVASCULAR: No chest pain, orthopnea, edema.  GASTROINTESTINAL: No nausea, vomiting, diarrhea or abdominal pain.  GENITOURINARY: No dysuria, hematuria.  ENDOCRINE: No polyuria, nocturia,  HEMATOLOGY: No anemia, easy bruising or bleeding SKIN: No rash or lesion. MUSCULOSKELETAL: No joint pain or arthritis.   NEUROLOGIC: No tingling, numbness, weakness.  PSYCHIATRY: No anxiety or depression.   ROS  DRUG ALLERGIES:   Allergies  Allergen Reactions  . Benadryl [Diphenhydramine Hcl (Sleep)] Nausea And Vomiting    VITALS:  Blood pressure (!) 149/76, pulse (!) 104, temperature 98.4 F (36.9 C), temperature source Oral, resp. rate (!) 22, height 5\' 3"  (1.6 m), weight 59.1 kg (130 lb 6 oz), SpO2 91 %.  PHYSICAL EXAMINATION:  GENERAL:  56 y.o.-year-old patient lying in the bed with no acute distress.  EYES: Pupils equal, round, reactive to light and accommodation. No scleral icterus. Extraocular muscles intact.  HEENT: Head atraumatic, normocephalic. Oropharynx and nasopharynx clear.  NECK:  Supple, no jugular venous distention. No thyroid enlargement, no tenderness.  LUNGS: Normal breath sounds bilaterally, no wheezing, b/l crepitation. Positive for use of accessory muscles of respiration. On 6 ltr oxygen. CARDIOVASCULAR: S1, S2 normal. No murmurs, rubs, or  gallops.  ABDOMEN: Soft, nontender, nondistended. Bowel sounds present. No organomegaly or mass.    Surgical marks on back. EXTREMITIES: No pedal edema, cyanosis, or clubbing.  NEUROLOGIC: Cranial nerves II through XII are intact. Muscle strength 5/5 in all extremities. Sensation intact. Gait not checked.  PSYCHIATRIC: The patient is alert and oriented x 3. Anxious and in pain. SKIN: No obvious rash, lesion, or ulcer.   Physical Exam LABORATORY PANEL:   CBC Recent Labs  Lab 12/03/16 0933  WBC 7.8  HGB 13.5  HCT 40.4  PLT 351   ------------------------------------------------------------------------------------------------------------------  Chemistries  Recent Labs  Lab 12/03/16 0933 12/04/16 0548  NA 135  --   K 3.9  --   CL 93*  --   CO2 27  --   GLUCOSE 107*  --   BUN 14  --   CREATININE 0.54* 0.39*  CALCIUM 8.9  --   MG  --  1.6*   ------------------------------------------------------------------------------------------------------------------  Cardiac Enzymes Recent Labs  Lab 12/03/16 0933  TROPONINI 0.04*   ------------------------------------------------------------------------------------------------------------------  RADIOLOGY:  Dg Chest Portable 1 View  Result Date: 12/03/2016 CLINICAL DATA:  Worsening shortness of breath over the past 6 weeks in patient with a left lower lobe mass. EXAM: PORTABLE CHEST 1 VIEW COMPARISON:  CT chest 11/23/2016. Single-view of the chest with 11/21/2014. FINDINGS: Extensive perihilar airspace disease is present bilaterally. Bulky mediastinal lymphadenopathy is identified in the left lower lobe mass is noted but better visualized on prior CT. Heart size is normal. No pneumothorax or pleural effusion. IMPRESSION: Extensive perihilar airspace disease could be due to pneumonia or edema. Bulky mediastinal lymphadenopathy  and left lower lobe mass as seen on prior CT. Electronically Signed   By: Inge Rise M.D.   On:  12/03/2016 09:50    ASSESSMENT AND PLAN:   Active Problems:   HCAP (healthcare-associated pneumonia)   #Acute hypoxemic respiratory failure secondary to healthcare associated pneumonia and underlying bronchogenic carcinoma Mid to MedSurg unit Broad spectrum IV antibiotics with cefepime and vancomycin Bronchodilator treatments Sputum culture and sensitivity Wean off oxygen as tolerated  # bacteria in one blood culture.    Looks contamination.   Follow results.   Already on vanc.   #Essential hypertension Continue home medication Norvasc and lisinopril and titrate as needed  # new findings of left lung mass-bronchogenic carcinoma Outpatient follow-up with [facility] oncology Called Inpatient consult to have further diagnostics.  # GERD PPI   # Tobacco abuse disorder Counseled patient to quit smoking for 5 minutes.  He is agreeable with nicotine patch   All the records are reviewed and case discussed with Care Management/Social Workerr. Management plans discussed with the patient, family and they are in agreement.  CODE STATUS: full  TOTAL TIME TAKING CARE OF THIS PATIENT: 35 minutes.   Pt's brother is in room during my visit.  POSSIBLE D/C IN 2-3 DAYS, DEPENDING ON CLINICAL CONDITION.   Vaughan Basta M.D on 12/04/2016   Between 7am to 6pm - Pager - [phone]  After 6pm go to [email or web address] - password [email or web address] [place] [facility]  Office  [phone]  CC: Primary care physician; [facility]  Note: This dictation was prepared with Dragon dictation along with smaller phrase technology. Any transcriptional errors that result from this process are unintentional.

## 2016-12-04 NOTE — Consult Note (Addendum)
Hematology/Oncology Consult note [facility] Telephone:([phone] [phone]  Patient Care Team: [facility] as PCP - General   Name of the patient: Steven Davis  [id number]  01-Jul-1960   Date of visit: 12/04/16 REASON FOR COSULTATION: lung mass  HISTORY OF PRESENT ILLNESS This is a 56 year old male with past medical history listed as below presented to emergency  Room for evaluation of  Shortness of breath. Patient does not use oxygen at home. Patient was seen by primary care physician he was prescribed antibiotics. He is symptoms persist as well as development of nonproductive cough as well as hoarseness. Patient is a heavy smoker.outpatient CT scan show left lower lobe mass.in the emergency room, his oxygen saturation on room air was 73%.patient was started on nasal cannula oxygen   Review of systems- Review of Systems  Constitutional: Negative for chills and fever.  HENT: Negative for hearing loss and tinnitus.   Eyes: Negative for blurred vision.  Respiratory: Positive for cough, shortness of breath and wheezing. Negative for hemoptysis and sputum production.   Cardiovascular: Negative for chest pain and leg swelling.  Gastrointestinal: Negative for heartburn.  Genitourinary: Negative for dysuria.  Musculoskeletal: Negative for myalgias.  Skin: Negative for rash.  Neurological: Positive for weakness. Negative for dizziness.  Endo/Heme/Allergies: Does not bruise/bleed easily.  Psychiatric/Behavioral: Negative for suicidal ideas.    Allergies  Allergen Reactions  . Benadryl [Diphenhydramine Hcl (Sleep)] Nausea And Vomiting    Patient Active Problem List   Diagnosis Date Noted  . HCAP (healthcare-associated pneumonia) 12/03/2016  . Anemia 11/21/2014  . Acute encephalopathy 06/06/2014  . Hyponatremia 06/06/2014  . HTN (hypertension) 06/06/2014  . GERD (gastroesophageal reflux disease) 06/06/2014     Past Medical  History:  Diagnosis Date  . Anemia   . Erectile dysfunction   . GERD (gastroesophageal reflux disease)   . Hypertension      Past Surgical History:  Procedure Laterality Date  . BACK SURGERY      Social History   Socioeconomic History  . Marital status: Single    Spouse name: Not on file  . Number of children: Not on file  . Years of education: Not on file  . Highest education level: Not on file  Social Needs  . Financial resource strain: Not on file  . Food insecurity - worry: Not on file  . Food insecurity - inability: Not on file  . Transportation needs - medical: Not on file  . Transportation needs - non-medical: Not on file  Occupational History  . Not on file  Tobacco Use  . Smoking status: Current Every Day Smoker    Packs/day: 1.00    Types: Cigarettes  . Smokeless tobacco: Never Used  Substance and Sexual Activity  . Alcohol use: Yes    Alcohol/week: 1.2 - 1.8 oz    Types: 2 - 3 Cans of beer per week  . Drug use: No  . Sexual activity: Not on file  Other Topics Concern  . Not on file  Social History Narrative  . Not on file     Family History  Problem Relation Age of Onset  . Hypertension Father      Current Facility-Administered Medications:  .  acetaminophen (TYLENOL) tablet 650 mg, 650 mg, Oral, Q6H PRN, Gouru, Aruna, MD .  albuterol (PROVENTIL) (2.5 MG/3ML) 0.083% nebulizer solution 3 mL, 3 mL, Inhalation, Q6H PRN, Gouru, Aruna, MD .  amLODipine (NORVASC) tablet 10 mg, 10 mg, Oral, Daily, Gouru,  Illene Silver, MD, 10 mg at 12/04/16 0855 .  ceFEPIme (MAXIPIME) 1 g in dextrose 5 % 50 mL IVPB, 1 g, Intravenous, Q8H, Gouru, Aruna, MD, Stopped at 12/04/16 0927 .  docusate sodium (COLACE) capsule 100 mg, 100 mg, Oral, BID PRN, Gouru, Aruna, MD, 100 mg at 12/03/16 2341 .  enoxaparin (LOVENOX) injection 40 mg, 40 mg, Subcutaneous, Q24H, Gouru, Aruna, MD, 40 mg at 12/04/16 1325 .  feeding supplement (ENSURE ENLIVE) (ENSURE ENLIVE) liquid 237 mL, 237 mL, Oral,  BID BM, Gouru, Aruna, MD, 237 mL at 12/04/16 1325 .  ferrous sulfate tablet 325 mg, 325 mg, Oral, Daily, Gouru, Aruna, MD, 325 mg at 12/04/16 0847 .  ipratropium-albuterol (DUONEB) 0.5-2.5 (3) MG/3ML nebulizer solution 3 mL, 3 mL, Nebulization, Q4H, Vaughan Basta, MD, 3 mL at 12/04/16 1406 .  lisinopril (PRINIVIL,ZESTRIL) tablet 40 mg, 40 mg, Oral, Daily, Gouru, Aruna, MD, 40 mg at 12/04/16 0855 .  MEDLINE mouth rinse, 15 mL, Mouth Rinse, BID, Gouru, Aruna, MD, 15 mL at 12/04/16 0858 .  methylPREDNISolone sodium succinate (SOLU-MEDROL) 125 mg/2 mL injection 60 mg, 60 mg, Intravenous, Q6H, Vaughan Basta, MD, 60 mg at 12/04/16 1358 .  nicotine (NICODERM CQ - dosed in mg/24 hours) patch 21 mg, 21 mg, Transdermal, Daily, Gouru, Aruna, MD, 21 mg at 12/04/16 0857 .  ondansetron (ZOFRAN) injection 4 mg, 4 mg, Intravenous, Q6H PRN, Gouru, Aruna, MD .  oxyCODONE (Oxy IR/ROXICODONE) immediate release tablet 5 mg, 5 mg, Oral, Q6H PRN, Vaughan Basta, MD, 5 mg at 12/04/16 0847 .  oxyCODONE (OXYCONTIN) 12 hr tablet 10 mg, 10 mg, Oral, BID, Gouru, Aruna, MD, 10 mg at 12/04/16 1018 .  pantoprazole (PROTONIX) EC tablet 40 mg, 40 mg, Oral, Daily, Gouru, Aruna, MD, 40 mg at 12/04/16 0847 .  vancomycin (VANCOCIN) IVPB 750 mg/150 ml premix, 750 mg, Intravenous, Q12H, Lenis Noon, [facility], Stopped at 12/04/16 1202   Physical exam:  Vitals:   12/04/16 0619 12/04/16 0719 12/04/16 0854 12/04/16 1429  BP:   (!) 149/76 127/73  Pulse: 91  (!) 104 90  Resp:   (!) 22   Temp:   98.4 F (36.9 C) 97.8 F (36.6 C)  TempSrc:   Oral Oral  SpO2: 96% 92% 91% 91%  Weight:      Height:       GENERAL:Alert,mild respiratory distress EYES: no pallor or icterus OROPHARYNX: no thrush or ulceration; good dentition  NECK: supple,  LYMPH: palpable left supraclavicular lymphadenopathy. LUNGS: decreased breath sound. Positive wheezing,t HEART/CVS: regular rate & rhythm and no murmurs; No lower extremity  edema ABDOMEN: abdomen soft, non-tender and normal bowel sounds Musculoskeletal:no cyanosis of digits and no clubbing  PSYCH: alert & oriented x 3  NEURO: no focal motor/sensory deficits SKIN:  no rashes or significant lesions     CMP Latest Ref Rng & Units 12/04/2016  Glucose 65 - 99 mg/dL -  BUN 6 - 20 mg/dL -  Creatinine 0.61 - 1.24 mg/dL 0.39(L)  Sodium 135 - 145 mmol/L -  Potassium 3.5 - 5.1 mmol/L -  Chloride 101 - 111 mmol/L -  CO2 22 - 32 mmol/L -  Calcium 8.9 - 10.3 mg/dL -  Total Protein 6.5 - 8.1 g/dL -  Total Bilirubin 0.3 - 1.2 mg/dL -  Alkaline Phos 38 - 126 U/L -  AST 15 - 41 U/L -  ALT 17 - 63 U/L -   CBC Latest Ref Rng & Units 12/03/2016  WBC 3.8 - 10.6 K/uL 7.8  Hemoglobin  13.0 - 18.0 g/dL 13.5  Hematocrit 40.0 - 52.0 % 40.4  Platelets 150 - 440 K/uL 351    @IMAGES @  Ct Chest W Contrast  Result Date: 11/23/2016 CLINICAL DATA:  Cough, loss of voice for several months, malaise EXAM: CT CHEST WITH CONTRAST TECHNIQUE: Multidetector CT imaging of the chest was performed during intravenous contrast administration. CONTRAST:  70mL ISOVUE-300 IOPAMIDOL (ISOVUE-300) INJECTION 61% COMPARISON:  Chest x-ray of 11/21/2014 FINDINGS: Cardiovascular: The heart is within normal limits in size. Diffuse coronary artery calcifications are present. Moderate abdominal aortic atherosclerosis is noted. The mid ascending thoracic aorta measures 34 mm in diameter. Mediastinum/Nodes: There is massive mediastinal and left hilar adenopathy present. A subcarinal node measures 29 mm in diameter. A right pretracheal node measures 28 mm in diameter within anterior prevascular node of 29 mm in diameter. Left hilar nodes are difficult to measure accurately due to volume loss. Adenopathy extends in the left paratracheal, left supraclavicular region, and left lower neck as well. Lungs/Pleura: There is a large rounded mass within much of the left lower lobe posterior medially. On image 81 series 2  this mass measures 8.4 x 11.8 cm with left hilar and mediastinal extension. This is consistent with a large left lower lobe carcinoma. Multiple primarily ground-glass nodules are throughout the left lung consistent with ipsilateral metastases. No definite right lung nodule is seen. No pleural effusion is noted. This mass causes extrinsic compression and narrowing of the left main stem bronchus as well as the bronchus to the left lower lobe and lingula. Upper Abdomen: There is suspicion of a pathologically enlarged left periaortic node on the last image, 169 series 2. No hepatic lesion is seen. Small noncalcified gallstones and/or gallbladder debris layer within the gallbladder. Moderate abdominal aortic atherosclerosis is noted. Musculoskeletal: Hardware for fusion of the lower cervical and lower thoracic - upper lumbar spine is noted no lytic or blastic bony lesion is seen with certainty. IMPRESSION: 1. Large mass occupies much of the left lower lobe with mediastinal and left hilar extension consistent with primary lung carcinoma. 2. Poorly defined ground-glass opacities within the left lung most consistent with ipsilateral lung metastases. 3. Diffuse coronary artery calcifications and changes of abdominal aortic atherosclerosis. 4. Gallbladder sludge and/or gallstones. Electronically Signed   By: Ivar Drape M.D.   On: 11/23/2016 12:13   Dg Chest Portable 1 View  Result Date: 12/03/2016 CLINICAL DATA:  Worsening shortness of breath over the past 6 weeks in patient with a left lower lobe mass. EXAM: PORTABLE CHEST 1 VIEW COMPARISON:  CT chest 11/23/2016. Single-view of the chest with 11/21/2014. FINDINGS: Extensive perihilar airspace disease is present bilaterally. Bulky mediastinal lymphadenopathy is identified in the left lower lobe mass is noted but better visualized on prior CT. Heart size is normal. No pneumothorax or pleural effusion. IMPRESSION: Extensive perihilar airspace disease could be due to  pneumonia or edema. Bulky mediastinal lymphadenopathy and left lower lobe mass as seen on prior CT. Electronically Signed   By: Inge Rise M.D.   On: 12/03/2016 09:50    Assessment and plan- Patient is a 56 y.o. male presented with for evaluation of shortness of breath, associated withcough, hoarseness.outpatient CT chest showed left lung mass.  # left lung mass with mediastinal lymphadenopathy as well as left supraclavicular lymphadenopathy.  Recommend obtain CT guided biopsy of left supraclavicular node, which is likely the most accessible site Recommend CT abdomen w contrast and MRI brain to complete staging.  Respiratory stress likely secondary to pneumonia  and narrowing of left main stem bronchus by extrinsic compression. Recommend consulting Rad Onc to establish care.  # continue  Thank you for this kind referral and the opportunity to participate in the care of this patient  Dr. Earlie Server, MD, PhD [facility] Pager- [phone] 12/04/2016

## 2016-12-04 NOTE — Progress Notes (Signed)
Pt complaining of 10/10 back pain. He has scheduled oxycontin 10mg  PO BID that is due at 1000 and 2200. In addition, his blood cx have come back growing gram (+) cocci. Dr. Marthann Schiller notified of both issues. He gave me verbal order for oxycodone 5mg  PO Q 6 hrs PRN severe pain and stated he is aware of the positive blood cultures and that there is a question that the culture may have been contaminated. He stated that the pt is already on IV vanc so he is covered.

## 2016-12-04 NOTE — Progress Notes (Signed)
PHARMACY - PHYSICIAN COMMUNICATION CRITICAL VALUE ALERT - BLOOD CULTURE IDENTIFICATION (BCID)  Results for orders placed or performed during the hospital encounter of 12/03/16  Blood Culture ID Panel (Reflexed) (Collected: 12/03/2016 10:07 AM)  Result Value Ref Range   Enterococcus species NOT DETECTED NOT DETECTED   Listeria monocytogenes NOT DETECTED NOT DETECTED   Staphylococcus species NOT DETECTED NOT DETECTED   Staphylococcus aureus NOT DETECTED NOT DETECTED   Streptococcus species NOT DETECTED NOT DETECTED   Streptococcus agalactiae NOT DETECTED NOT DETECTED   Streptococcus pneumoniae NOT DETECTED NOT DETECTED   Streptococcus pyogenes NOT DETECTED NOT DETECTED   Acinetobacter baumannii NOT DETECTED NOT DETECTED   Enterobacteriaceae species NOT DETECTED NOT DETECTED   Enterobacter cloacae complex NOT DETECTED NOT DETECTED   Escherichia coli NOT DETECTED NOT DETECTED   Klebsiella oxytoca NOT DETECTED NOT DETECTED   Klebsiella pneumoniae NOT DETECTED NOT DETECTED   Proteus species NOT DETECTED NOT DETECTED   Serratia marcescens NOT DETECTED NOT DETECTED   Haemophilus influenzae NOT DETECTED NOT DETECTED   Neisseria meningitidis NOT DETECTED NOT DETECTED   Pseudomonas aeruginosa NOT DETECTED NOT DETECTED   Candida albicans NOT DETECTED NOT DETECTED   Candida glabrata NOT DETECTED NOT DETECTED   Candida krusei NOT DETECTED NOT DETECTED   Candida parapsilosis NOT DETECTED NOT DETECTED   Candida tropicalis NOT DETECTED NOT DETECTED    Name of physician (or Provider) Contacted: Dr Edwina Barth   Changes to prescribed antibiotics required: No.  Anti-infectives (From admission, onward)   Start     Dose/Rate Route Frequency Ordered Stop   12/03/16 2100  vancomycin (VANCOCIN) IVPB 750 mg/150 ml premix     750 mg 150 mL/hr over 60 Minutes Intravenous Every 12 hours 12/03/16 1410     12/03/16 1500  vancomycin (VANCOCIN) IVPB 1000 mg/200 mL premix     1,000 mg 200 mL/hr over 60 Minutes  Intravenous  Once 12/03/16 1406 12/03/16 1806   12/03/16 1400  ceFEPIme (MAXIPIME) 1 g in dextrose 5 % 50 mL IVPB     1 g 100 mL/hr over 30 Minutes Intravenous Every 8 hours 12/03/16 1340 12/11/16 1629   12/03/16 1215  vancomycin (VANCOCIN) IVPB 1000 mg/200 mL premix  Status:  Discontinued     1,000 mg 200 mL/hr over 60 Minutes Intravenous  Once 12/03/16 1208 12/03/16 1406   12/03/16 1215  piperacillin-tazobactam (ZOSYN) IVPB 3.375 g     3.375 g 100 mL/hr over 30 Minutes Intravenous  Once 12/03/16 1208 12/03/16 1445   12/03/16 1000  levofloxacin (LEVAQUIN) IVPB 750 mg     750 mg 100 mL/hr over 90 Minutes Intravenous  Once 12/03/16 0959 12/03/16 1203       Culture was 3/4 aerobic bottles grew GPR's but no ID. They are going to send to [facility] for additional workup.   Donna Christen Lajada Janes 12/04/2016  6:22 PM

## 2016-12-05 ENCOUNTER — Encounter: Payer: Self-pay | Admitting: Radiology

## 2016-12-05 ENCOUNTER — Inpatient Hospital Stay: Payer: Medicare Other

## 2016-12-05 DIAGNOSIS — J441 Chronic obstructive pulmonary disease with (acute) exacerbation: Secondary | ICD-10-CM

## 2016-12-05 DIAGNOSIS — J9600 Acute respiratory failure, unspecified whether with hypoxia or hypercapnia: Secondary | ICD-10-CM

## 2016-12-05 DIAGNOSIS — K769 Liver disease, unspecified: Secondary | ICD-10-CM

## 2016-12-05 LAB — BASIC METABOLIC PANEL
ANION GAP: 12 (ref 5–15)
BUN: 10 mg/dL (ref 6–20)
CALCIUM: 9.1 mg/dL (ref 8.9–10.3)
CO2: 28 mmol/L (ref 22–32)
Chloride: 90 mmol/L — ABNORMAL LOW (ref 101–111)
Creatinine, Ser: 0.32 mg/dL — ABNORMAL LOW (ref 0.61–1.24)
Glucose, Bld: 141 mg/dL — ABNORMAL HIGH (ref 65–99)
POTASSIUM: 3.6 mmol/L (ref 3.5–5.1)
SODIUM: 130 mmol/L — AB (ref 135–145)

## 2016-12-05 LAB — CBC
HCT: 39.2 % — ABNORMAL LOW (ref 40.0–52.0)
Hemoglobin: 13.3 g/dL (ref 13.0–18.0)
MCH: 32.1 pg (ref 26.0–34.0)
MCHC: 33.8 g/dL (ref 32.0–36.0)
MCV: 94.8 fL (ref 80.0–100.0)
PLATELETS: 350 10*3/uL (ref 150–440)
RBC: 4.13 MIL/uL — AB (ref 4.40–5.90)
RDW: 15.6 % — AB (ref 11.5–14.5)
WBC: 8.3 10*3/uL (ref 3.8–10.6)

## 2016-12-05 LAB — EXPECTORATED SPUTUM ASSESSMENT W REFEX TO RESP CULTURE

## 2016-12-05 LAB — STREP PNEUMONIAE URINARY ANTIGEN: Strep Pneumo Urinary Antigen: NEGATIVE

## 2016-12-05 LAB — EXPECTORATED SPUTUM ASSESSMENT W GRAM STAIN, RFLX TO RESP C

## 2016-12-05 LAB — VANCOMYCIN, TROUGH: VANCOMYCIN TR: 6 ug/mL — AB (ref 15–20)

## 2016-12-05 MED ORDER — ENOXAPARIN SODIUM 40 MG/0.4ML ~~LOC~~ SOLN
40.0000 mg | SUBCUTANEOUS | Status: AC
  Administered 2016-12-05: 11:00:00 40 mg via SUBCUTANEOUS
  Filled 2016-12-05: qty 0.4

## 2016-12-05 MED ORDER — IOPAMIDOL (ISOVUE-300) INJECTION 61%
30.0000 mL | Freq: Once | INTRAVENOUS | Status: AC
  Administered 2016-12-05: 11:00:00 30 mL via ORAL

## 2016-12-05 MED ORDER — IOPAMIDOL (ISOVUE-300) INJECTION 61%
100.0000 mL | Freq: Once | INTRAVENOUS | Status: AC | PRN
  Administered 2016-12-05: 100 mL via INTRAVENOUS

## 2016-12-05 NOTE — Progress Notes (Signed)
Hematology/Oncology Progress Note [facility] [phone] [phone]  Patient Care Team: [facility] as PCP - General   Name of the patient: Steven Davis  [id number]  1960-03-03  Date of visit: 12/05/16  INTERVAL HISTORY Patient was seen and examined at bedside. He reports breath is better and he had a good night. He continue to have cough. He tells me that his brother's wife works in [place] and ultimately he prefers to go to [facility] for cancer care. He is open to have some work up done here including biopsy.  He tells me that he usually lies on his right side because of chronic back problem. He feels more comfortable breathing today.   Review of systems- Review of Systems  Constitutional: Positive for malaise/fatigue and weight loss. Negative for chills and fever.  HENT: Negative for hearing loss.   Eyes: Negative for blurred vision.  Respiratory: Positive for cough, sputum production, shortness of breath and wheezing. Negative for hemoptysis.   Cardiovascular: Negative for chest pain and leg swelling.  Gastrointestinal: Negative for heartburn.  Genitourinary: Negative for dysuria.  Musculoskeletal: Negative for myalgias.  Skin: Negative for rash.  Neurological: Negative for dizziness.  Endo/Heme/Allergies: Does not bruise/bleed easily.  Psychiatric/Behavioral: Negative for depression.    Allergies  Allergen Reactions  . Benadryl [Diphenhydramine Hcl (Sleep)] Nausea And Vomiting    Patient Active Problem List   Diagnosis Date Noted  . Dyspnea   . Mass of left lung   . HCAP (healthcare-associated pneumonia) 12/03/2016  . Anemia 11/21/2014  . Acute encephalopathy 06/06/2014  . Hyponatremia 06/06/2014  . HTN (hypertension) 06/06/2014  . GERD (gastroesophageal reflux disease) 06/06/2014     Past Medical History:  Diagnosis Date  . Anemia   . Erectile dysfunction   . GERD (gastroesophageal reflux disease)   .  Hypertension      Past Surgical History:  Procedure Laterality Date  . BACK SURGERY      Social History   Socioeconomic History  . Marital status: Single    Spouse name: Not on file  . Number of children: Not on file  . Years of education: Not on file  . Highest education level: Not on file  Social Needs  . Financial resource strain: Not on file  . Food insecurity - worry: Not on file  . Food insecurity - inability: Not on file  . Transportation needs - medical: Not on file  . Transportation needs - non-medical: Not on file  Occupational History  . Not on file  Tobacco Use  . Smoking status: Current Every Day Smoker    Packs/day: 1.00    Types: Cigarettes  . Smokeless tobacco: Never Used  Substance and Sexual Activity  . Alcohol use: Yes    Alcohol/week: 1.2 - 1.8 oz    Types: 2 - 3 Cans of beer per week  . Drug use: No  . Sexual activity: Not on file  Other Topics Concern  . Not on file  Social History Narrative  . Not on file     Family History  Problem Relation Age of Onset  . Hypertension Father      Current Facility-Administered Medications:  .  acetaminophen (TYLENOL) tablet 650 mg, 650 mg, Oral, Q6H PRN, Gouru, Aruna, MD .  albuterol (PROVENTIL) (2.5 MG/3ML) 0.083% nebulizer solution 3 mL, 3 mL, Inhalation, Q6H PRN, Gouru, Aruna, MD .  amLODipine (NORVASC) tablet 10 mg, 10 mg, Oral, Daily, Gouru, Aruna, MD, 10 mg  at 12/05/16 1028 .  ceFEPIme (MAXIPIME) 1 g in dextrose 5 % 50 mL IVPB, 1 g, Intravenous, Q8H, Gouru, Aruna, MD, Stopped at 12/05/16 1139 .  docusate sodium (COLACE) capsule 100 mg, 100 mg, Oral, BID PRN, Gouru, Aruna, MD, 100 mg at 12/03/16 2341 .  feeding supplement (ENSURE ENLIVE) (ENSURE ENLIVE) liquid 237 mL, 237 mL, Oral, BID BM, Gouru, Aruna, MD, 237 mL at 12/04/16 1325 .  ferrous sulfate tablet 325 mg, 325 mg, Oral, Daily, Gouru, Aruna, MD, 325 mg at 12/05/16 1028 .  ipratropium-albuterol (DUONEB) 0.5-2.5 (3) MG/3ML nebulizer solution 3  mL, 3 mL, Nebulization, Q4H, Vaughan Basta, MD, 3 mL at 12/05/16 1121 .  lisinopril (PRINIVIL,ZESTRIL) tablet 40 mg, 40 mg, Oral, Daily, Gouru, Aruna, MD, 40 mg at 12/05/16 1028 .  MEDLINE mouth rinse, 15 mL, Mouth Rinse, BID, Gouru, Aruna, MD, 15 mL at 12/04/16 2220 .  methylPREDNISolone sodium succinate (SOLU-MEDROL) 125 mg/2 mL injection 60 mg, 60 mg, Intravenous, Q6H, Vaughan Basta, MD, 60 mg at 12/05/16 1030 .  nicotine (NICODERM CQ - dosed in mg/24 hours) patch 21 mg, 21 mg, Transdermal, Daily, Gouru, Aruna, MD, 21 mg at 12/04/16 0857 .  ondansetron (ZOFRAN) injection 4 mg, 4 mg, Intravenous, Q6H PRN, Gouru, Aruna, MD .  oxyCODONE (Oxy IR/ROXICODONE) immediate release tablet 5 mg, 5 mg, Oral, Q6H PRN, Vaughan Basta, MD, 5 mg at 12/05/16 0607 .  oxyCODONE (OXYCONTIN) 12 hr tablet 10 mg, 10 mg, Oral, BID, Gouru, Aruna, MD, 10 mg at 12/05/16 1028 .  pantoprazole (PROTONIX) EC tablet 40 mg, 40 mg, Oral, Daily, Gouru, Aruna, MD, 40 mg at 12/05/16 1028 .  vancomycin (VANCOCIN) IVPB 750 mg/150 ml premix, 750 mg, Intravenous, Q12H, Lenis Noon, [place], Stopped at 12/04/16 2349   Physical exam:  Vitals:   12/05/16 0517 12/05/16 0712 12/05/16 1032 12/05/16 1123  BP: (!) 166/91  (!) 159/93   Pulse: 96  93   Resp: (!) 22     Temp: 98.4 F (36.9 C)     TempSrc: Oral     SpO2: 94% 94% 94% 91%  Weight:      Height:       GENERAL:Alert, NAD. EYES: no pallor or icterus OROPHARYNX: no thrush or ulceration; good dentition  NECK: supple,  LYMPH: palpable left supraclavicular lymphadenopathy. LUNGS: decreased breath sound. Positive wheezing. Intermittent coughs.  HEART/CVS: regular rate & rhythm and no murmurs; No lower extremity edema ABDOMEN: abdomen soft, non-tender and normal bowel sounds, Musculoskeletal:no cyanosis of digits and no clubbing. cushion pads along his spine.  PSYCH: alert & oriented x 3  NEURO: no focal motor/sensory deficits SKIN:  no rashes or  significant lesions      CMP Latest Ref Rng & Units 12/05/2016  Glucose 65 - 99 mg/dL 141(H)  BUN 6 - 20 mg/dL 10  Creatinine 0.61 - 1.24 mg/dL 0.32(L)  Sodium 135 - 145 mmol/L 130(L)  Potassium 3.5 - 5.1 mmol/L 3.6  Chloride 101 - 111 mmol/L 90(L)  CO2 22 - 32 mmol/L 28  Calcium 8.9 - 10.3 mg/dL 9.1  Total Protein 6.5 - 8.1 g/dL -  Total Bilirubin 0.3 - 1.2 mg/dL -  Alkaline Phos 38 - 126 U/L -  AST 15 - 41 U/L -  ALT 17 - 63 U/L -   CBC Latest Ref Rng & Units 12/05/2016  WBC 3.8 - 10.6 K/uL 8.3  Hemoglobin 13.0 - 18.0 g/dL 13.3  Hematocrit 40.0 - 52.0 % 39.2(L)  Platelets 150 - 440 K/uL  350    @IMAGES @  Dg Chest 2 View  Result Date: 12/05/2016 CLINICAL DATA:  Shortness of breath for 2-3 days.  Pulmonary mass. EXAM: CHEST  2 VIEW COMPARISON:  Chest radiograph 12/03/2017 FINDINGS: The cardiomediastinal silhouette is distorted by a known left pulmonary mass. Fullness of bilateral hilar regions. There diffusely increased interstitial markings, right more than left. There has been interval development of probable right pleural effusion. No evidence of pneumothorax. Bilateral apical pleural thickening. Stable spinal fusion across moderate lower thoracic level compression fracture. Lower cervical fusion noted. Soft tissues are grossly normal. IMPRESSION: Interval development of increased interstitial markings, which may be seen with interstitial pulmonary edema or hemorrhage. Interval development of right pleural effusion. Known left pulmonary mass. Electronically Signed   By: Fidela Salisbury M.D.   On: 12/05/2016 07:48   Ct Chest W Contrast  Result Date: 11/23/2016 CLINICAL DATA:  Cough, loss of voice for several months, malaise EXAM: CT CHEST WITH CONTRAST TECHNIQUE: Multidetector CT imaging of the chest was performed during intravenous contrast administration. CONTRAST:  9mL ISOVUE-300 IOPAMIDOL (ISOVUE-300) INJECTION 61% COMPARISON:  Chest x-ray of 11/21/2014 FINDINGS:  Cardiovascular: The heart is within normal limits in size. Diffuse coronary artery calcifications are present. Moderate abdominal aortic atherosclerosis is noted. The mid ascending thoracic aorta measures 34 mm in diameter. Mediastinum/Nodes: There is massive mediastinal and left hilar adenopathy present. A subcarinal node measures 29 mm in diameter. A right pretracheal node measures 28 mm in diameter within anterior prevascular node of 29 mm in diameter. Left hilar nodes are difficult to measure accurately due to volume loss. Adenopathy extends in the left paratracheal, left supraclavicular region, and left lower neck as well. Lungs/Pleura: There is a large rounded mass within much of the left lower lobe posterior medially. On image 81 series 2 this mass measures 8.4 x 11.8 cm with left hilar and mediastinal extension. This is consistent with a large left lower lobe carcinoma. Multiple primarily ground-glass nodules are throughout the left lung consistent with ipsilateral metastases. No definite right lung nodule is seen. No pleural effusion is noted. This mass causes extrinsic compression and narrowing of the left main stem bronchus as well as the bronchus to the left lower lobe and lingula. Upper Abdomen: There is suspicion of a pathologically enlarged left periaortic node on the last image, 169 series 2. No hepatic lesion is seen. Small noncalcified gallstones and/or gallbladder debris layer within the gallbladder. Moderate abdominal aortic atherosclerosis is noted. Musculoskeletal: Hardware for fusion of the lower cervical and lower thoracic - upper lumbar spine is noted no lytic or blastic bony lesion is seen with certainty. IMPRESSION: 1. Large mass occupies much of the left lower lobe with mediastinal and left hilar extension consistent with primary lung carcinoma. 2. Poorly defined ground-glass opacities within the left lung most consistent with ipsilateral lung metastases. 3. Diffuse coronary artery  calcifications and changes of abdominal aortic atherosclerosis. 4. Gallbladder sludge and/or gallstones. Electronically Signed   By: Ivar Drape M.D.   On: 11/23/2016 12:13   Dg Chest Portable 1 View  Result Date: 12/03/2016 CLINICAL DATA:  Worsening shortness of breath over the past 6 weeks in patient with a left lower lobe mass. EXAM: PORTABLE CHEST 1 VIEW COMPARISON:  CT chest 11/23/2016. Single-view of the chest with 11/21/2014. FINDINGS: Extensive perihilar airspace disease is present bilaterally. Bulky mediastinal lymphadenopathy is identified in the left lower lobe mass is noted but better visualized on prior CT. Heart size is normal. No pneumothorax or pleural  effusion. IMPRESSION: Extensive perihilar airspace disease could be due to pneumonia or edema. Bulky mediastinal lymphadenopathy and left lower lobe mass as seen on prior CT. Electronically Signed   By: Inge Rise M.D.   On: 12/03/2016 09:50    Assessment and plan-  Patient is a 56 y.o. male presented with for evaluation of shortness of breath, associated withcough, hoarseness.outpatient CT chest showed left lung mass.  # left lung mass with mediastinal lymphadenopathy as well as left supraclavicular lymphadenopathy.  Recommend obtain CT guided biopsy of left supraclavicular node, which is likely the most accessible site  CT abdomen w contrast and MRI brain to complete staging.  Respiratory stress likely secondary to pneumonia and narrowing of left main stem bronchus by extrinsic compression. Recommend consulting Rad Onc to establish care.  I offered him to follow up with me outpatient to discuss pathology and treatment plan. He wishes to follow up with [facility] oncology outpatient. Thank you for this kind referral and the opportunity to participate in the care of this patient  Earlie Server, MD, PhD [facility] Pager- [phone] 12/05/2016

## 2016-12-05 NOTE — Progress Notes (Signed)
[place] [facility] [place] NAME: Steven Davis    MR#:  [id number]  DATE OF BIRTH:  [birth year]  SUBJECTIVE:  CHIEF COMPLAINT:   Chief Complaint  Patient presents with  . Shortness of Breath     Very SOB, and In pain.   reepatedly treated with Abx for Respi infections in last 3 weeks. Finally CT chest showed a mass- likely cancer in lung, 2 days ago.  feels better now. Still on 4 ltr oxygen.  REVIEW OF SYSTEMS:  CONSTITUTIONAL: No fever, fatigue or weakness.  EYES: No blurred or double vision.  EARS, NOSE, AND THROAT: No tinnitus or ear pain.  RESPIRATORY: positive for cough, shortness of breath, no wheezing or hemoptysis.  CARDIOVASCULAR: No chest pain, orthopnea, edema.  GASTROINTESTINAL: No nausea, vomiting, diarrhea or abdominal pain.  GENITOURINARY: No dysuria, hematuria.  ENDOCRINE: No polyuria, nocturia,  HEMATOLOGY: No anemia, easy bruising or bleeding SKIN: No rash or lesion. MUSCULOSKELETAL: No joint pain or arthritis.   NEUROLOGIC: No tingling, numbness, weakness.  PSYCHIATRY: No anxiety or depression.   ROS  DRUG ALLERGIES:   Allergies  Allergen Reactions  . Benadryl [Diphenhydramine Hcl (Sleep)] Nausea And Vomiting    VITALS:  Blood pressure (!) 159/93, pulse 93, temperature 98.4 F (36.9 C), temperature source Oral, resp. rate (!) 22, height 5\' 3"  (1.6 m), weight 59.1 kg (130 lb 6 oz), SpO2 91 %.  PHYSICAL EXAMINATION:  GENERAL:  56 y.o.-year-old patient lying in the bed with no acute distress.  EYES: Pupils equal, round, reactive to light and accommodation. No scleral icterus. Extraocular muscles intact.  HEENT: Head atraumatic, normocephalic. Oropharynx and nasopharynx clear.  NECK:  Supple, no jugular venous distention. No thyroid enlargement, no tenderness.  LUNGS: Normal breath sounds bilaterally, no wheezing, b/l crepitation. Positive for use of accessory muscles of respiration. On 6 ltr oxygen. CARDIOVASCULAR:  S1, S2 normal. No murmurs, rubs, or gallops.  ABDOMEN: Soft, nontender, nondistended. Bowel sounds present. No organomegaly or mass.    Surgical marks on back. EXTREMITIES: No pedal edema, cyanosis, or clubbing.  NEUROLOGIC: Cranial nerves II through XII are intact. Muscle strength 5/5 in all extremities. Sensation intact. Gait not checked.  PSYCHIATRIC: The patient is alert and oriented x 3. Anxious and in pain. SKIN: No obvious rash, lesion, or ulcer.   Physical Exam LABORATORY PANEL:   CBC Recent Labs  Lab 12/05/16 0453  WBC 8.3  HGB 13.3  HCT 39.2*  PLT 350   ------------------------------------------------------------------------------------------------------------------  Chemistries  Recent Labs  Lab 12/04/16 0548 12/05/16 0453  NA  --  130*  K  --  3.6  CL  --  90*  CO2  --  28  GLUCOSE  --  141*  BUN  --  10  CREATININE 0.39* 0.32*  CALCIUM  --  9.1  MG 1.6*  --    ------------------------------------------------------------------------------------------------------------------  Cardiac Enzymes Recent Labs  Lab 12/03/16 0933  TROPONINI 0.04*   ------------------------------------------------------------------------------------------------------------------  RADIOLOGY:  Dg Chest 2 View  Result Date: 12/05/2016 CLINICAL DATA:  Shortness of breath for 2-3 days.  Pulmonary mass. EXAM: CHEST  2 VIEW COMPARISON:  Chest radiograph 12/03/2017 FINDINGS: The cardiomediastinal silhouette is distorted by a known left pulmonary mass. Fullness of bilateral hilar regions. There diffusely increased interstitial markings, right more than left. There has been interval development of probable right pleural effusion. No evidence of pneumothorax. Bilateral apical pleural thickening. Stable spinal fusion across moderate lower thoracic level compression fracture. Lower cervical fusion  noted. Soft tissues are grossly normal. IMPRESSION: Interval development of increased  interstitial markings, which may be seen with interstitial pulmonary edema or hemorrhage. Interval development of right pleural effusion. Known left pulmonary mass. Electronically Signed   By: Fidela Salisbury M.D.   On: 12/05/2016 07:48   Mr Brain Wo Contrast  Result Date: 12/05/2016 CLINICAL DATA:  Small cell lung cancer staging EXAM: MRI HEAD WITHOUT CONTRAST TECHNIQUE: Multiplanar, multiecho pulse sequences of the brain and surrounding structures were obtained without intravenous contrast. COMPARISON:  CT 06/05/2014 FINDINGS: Limited study. The patient was not able to hold still due to extreme discomfort. Limited imaging. No contrast administered Axial diffusion-weighted imaging is diagnostic. No acute infarct. No areas of restricted diffusion. Ventricle size normal.  No mass or fluid collection identified. IMPRESSION: Limited study. The patient was not able to complete the study and minimal imaging was performed. Allowing for this no significant abnormality was identified. Electronically Signed   By: Franchot Gallo M.D.   On: 12/05/2016 13:24    ASSESSMENT AND PLAN:   Active Problems:   HCAP (healthcare-associated pneumonia)   Dyspnea   Lung mass   #Acute hypoxemic respiratory failure secondary to healthcare associated pneumonia and underlying bronchogenic carcinoma Mid to MedSurg unit Broad spectrum IV antibiotics with cefepime and vancomycin Bronchodilator treatments Sputum culture and sensitivity Wean off oxygen as tolerated  # bacteria in one blood culture.    Looks contamination.   Follow results.   Already on vanc.   Will call ID tomorrow.  #Essential hypertension Continue home medication Norvasc and lisinopril and titrate as needed  # new findings of left lung mass-bronchogenic carcinoma Outpatient follow-up with [facility] oncology Called Inpatient consult to have further diagnostics.   Ordered CT abd, MRI brrain for mets and Will get radiology to do Biopsy of  Supraclavicular nodes.  # GERD PPI  # Tobacco abuse disorder Counseled patient to quit smoking for 5 minutes.  He is agreeable with nicotine patch   All the records are reviewed and case discussed with Care Management/Social Workerr. Management plans discussed with the patient, family and they are in agreement.  CODE STATUS: full  TOTAL TIME TAKING CARE OF THIS PATIENT: 35 minutes.   Pt's brother is in room during my visit.  POSSIBLE D/C IN 2-3 DAYS, DEPENDING ON CLINICAL CONDITION.   Vaughan Basta M.D on 12/05/2016   Between 7am to 6pm - Pager - [phone]  After 6pm go to [email or web address] - password [email or web address] [facility]  Office  [phone]  CC: Primary care physician; [facility]  Note: This dictation was prepared with Dragon dictation along with smaller phrase technology. Any transcriptional errors that result from this process are unintentional.

## 2016-12-06 ENCOUNTER — Encounter: Payer: Self-pay | Admitting: Oncology

## 2016-12-06 ENCOUNTER — Telehealth: Payer: Self-pay | Admitting: *Deleted

## 2016-12-06 ENCOUNTER — Inpatient Hospital Stay: Payer: Medicare Other

## 2016-12-06 ENCOUNTER — Institutional Professional Consult (permissible substitution): Payer: Medicare Other | Admitting: Radiation Oncology

## 2016-12-06 ENCOUNTER — Other Ambulatory Visit: Payer: Self-pay | Admitting: *Deleted

## 2016-12-06 DIAGNOSIS — R918 Other nonspecific abnormal finding of lung field: Secondary | ICD-10-CM

## 2016-12-06 DIAGNOSIS — R0902 Hypoxemia: Secondary | ICD-10-CM

## 2016-12-06 LAB — PROTIME-INR
INR: 1.01
PROTHROMBIN TIME: 13.2 s (ref 11.4–15.2)

## 2016-12-06 LAB — CULTURE, BLOOD (ROUTINE X 2)

## 2016-12-06 LAB — HIV ANTIBODY (ROUTINE TESTING W REFLEX): HIV SCREEN 4TH GENERATION: NONREACTIVE

## 2016-12-06 MED ORDER — VANCOMYCIN HCL IN DEXTROSE 1-5 GM/200ML-% IV SOLN
1000.0000 mg | Freq: Two times a day (BID) | INTRAVENOUS | Status: DC
  Filled 2016-12-06: qty 200

## 2016-12-06 MED ORDER — VANCOMYCIN HCL IN DEXTROSE 750-5 MG/150ML-% IV SOLN
750.0000 mg | Freq: Three times a day (TID) | INTRAVENOUS | Status: DC
  Filled 2016-12-06 (×2): qty 150

## 2016-12-06 MED ORDER — FENTANYL CITRATE (PF) 100 MCG/2ML IJ SOLN
INTRAMUSCULAR | Status: AC
  Filled 2016-12-06: qty 2

## 2016-12-06 MED ORDER — SODIUM CHLORIDE 0.9 % IV SOLN
INTRAVENOUS | Status: AC | PRN
  Administered 2016-12-06: 100 mL/h via INTRAVENOUS

## 2016-12-06 MED ORDER — MIDAZOLAM HCL 5 MG/5ML IJ SOLN
INTRAMUSCULAR | Status: AC
  Filled 2016-12-06: qty 5

## 2016-12-06 NOTE — Consult Note (Signed)
NEW PATIENT EVALUATION  Name: Steven Davis  MRN: [id number]  Date:   12/03/2016     DOB: 08-Aug-1960   This 56 y.o. male patient presents to the clinic for initial evaluation of locally advanced probable primary bronchogenic lung CA.  REFERRING PHYSICIAN: No ref. provider found  CHIEF COMPLAINT:  Chief Complaint  Patient presents with  . Shortness of Breath    DIAGNOSIS: The primary encounter diagnosis was Dyspnea, unspecified type. Diagnoses of Congestive heart failure, unspecified HF chronicity, unspecified heart failure type ([place]), Hypoxia, HCAP (healthcare-associated pneumonia), and Lung mass were also pertinent to this visit.   PREVIOUS INVESTIGATIONS:  CT scan is reviewed Clinical notes reviewed  HPI: Patient is a 56 year old male recently admitted to [facility] through the emergency room with increasing shortness of breath. He recently been diagnosed with a lung mass which was in the process of being worked up. CT scan of his chest performed yesterday showed a large left lower lobe mass with several adjacent nodules consistent with probable intrapulmonary metastatic disease. The mass involving mediastinum and left hilar extension consistent with primary bronchogenic carcinoma. He also has probable supraclavicular adenopathy. Biopsy of his supra clavicular lymph node is planned. I been asked to evaluate the patient for possibility of treatment. He has family that works at [facility] and does prefer his treatments to be delivered there. Patient is also in need of a PET CT scan for complete staging purposes. Seen today in his hospital bed he is doing fair. Does have problems with laying flat on a bed.  PLANNED TREATMENT REGIMEN: To be determined  PAST MEDICAL HISTORY:  has a past medical history of Anemia, Erectile dysfunction, GERD (gastroesophageal reflux disease), and Hypertension.    PAST SURGICAL HISTORY:  Past Surgical History:  Procedure Laterality Date  . BACK SURGERY      FAMILY  HISTORY: family history includes Hypertension in his father.  SOCIAL HISTORY:  reports that he has been smoking cigarettes.  He has been smoking about 1.00 pack per day. he has never used smokeless tobacco. He reports that he drinks about 1.2 - 1.8 oz of alcohol per week. He reports that he does not use drugs.  ALLERGIES: Benadryl [diphenhydramine hcl (sleep)]  MEDICATIONS:  Current Facility-Administered Medications  Medication Dose Route Frequency Provider Last Rate Last Dose  . acetaminophen (TYLENOL) tablet 650 mg  650 mg Oral Q6H PRN Gouru, Aruna, MD      . albuterol (PROVENTIL) (2.5 MG/3ML) 0.083% nebulizer solution 3 mL  3 mL Inhalation Q6H PRN Gouru, Aruna, MD      . amLODipine (NORVASC) tablet 10 mg  10 mg Oral Daily Gouru, Aruna, MD   10 mg at 12/06/16 1059  . ceFEPIme (MAXIPIME) 1 g in dextrose 5 % 50 mL IVPB  1 g Intravenous Jeanella Anton, MD   Stopped at 12/06/16 [phone]  . docusate sodium (COLACE) capsule 100 mg  100 mg Oral BID PRN Nicholes Mango, MD   100 mg at 12/03/16 2341  . feeding supplement (ENSURE ENLIVE) (ENSURE ENLIVE) liquid 237 mL  237 mL Oral BID BM Gouru, Aruna, MD   237 mL at 12/04/16 1325  . ferrous sulfate tablet 325 mg  325 mg Oral Daily Gouru, Aruna, MD   325 mg at 12/06/16 1059  . ipratropium-albuterol (DUONEB) 0.5-2.5 (3) MG/3ML nebulizer solution 3 mL  3 mL Nebulization Q4H Vaughan Basta, MD   3 mL at 12/06/16 1131  . lisinopril (PRINIVIL,ZESTRIL) tablet 40 mg  40 mg Oral Daily  Nicholes Mango, MD   40 mg at 12/06/16 1059  . MEDLINE mouth rinse  15 mL Mouth Rinse BID Gouru, Aruna, MD   15 mL at 12/06/16 1100  . methylPREDNISolone sodium succinate (SOLU-MEDROL) 125 mg/2 mL injection 60 mg  60 mg Intravenous Q6H Vaughan Basta, MD   60 mg at 12/06/16 0736  . nicotine (NICODERM CQ - dosed in mg/24 hours) patch 21 mg  21 mg Transdermal Daily Gouru, Aruna, MD   21 mg at 12/06/16 1059  . ondansetron (ZOFRAN) injection 4 mg  4 mg Intravenous Q6H PRN  Gouru, Aruna, MD      . oxyCODONE (Oxy IR/ROXICODONE) immediate release tablet 5 mg  5 mg Oral Q6H PRN Vaughan Basta, MD   5 mg at 12/06/16 0646  . oxyCODONE (OXYCONTIN) 12 hr tablet 10 mg  10 mg Oral BID Gouru, Aruna, MD   10 mg at 12/06/16 1059  . pantoprazole (PROTONIX) EC tablet 40 mg  40 mg Oral Daily Gouru, Aruna, MD   40 mg at 12/06/16 1059  . vancomycin (VANCOCIN) IVPB 750 mg/150 ml premix  750 mg Intravenous Q12H Lenis Noon, RPH 150 mL/hr at 12/06/16 1059 750 mg at 12/06/16 1059    ECOG PERFORMANCE STATUS:  2 - Symptomatic, <50% confined to bed  REVIEW OF SYSTEMS:  Patient denies any weight loss, fatigue, weakness, fever, chills or night sweats. Patient denies any loss of vision, blurred vision. Patient denies any ringing  of the ears or hearing loss. No irregular heartbeat. Patient denies heart murmur or history of fainting. Patient denies any chest pain or pain radiating to her upper extremities. Patient denies any shortness of breath, difficulty breathing at night, cough or hemoptysis. Patient denies any swelling in the lower legs. Patient denies any nausea vomiting, vomiting of blood, or coffee ground material in the vomitus. Patient denies any stomach pain. Patient states has had normal bowel movements no significant constipation or diarrhea. Patient denies any dysuria, hematuria or significant nocturia. Patient denies any problems walking, swelling in the joints or loss of balance. Patient denies any skin changes, loss of hair or loss of weight. Patient denies any excessive worrying or anxiety or significant depression. Patient denies any problems with insomnia. Patient denies excessive thirst, polyuria, polydipsia. Patient denies any swollen glands, patient denies easy bruising or easy bleeding. Patient denies any recent infections, allergies or URI. Patient "s visual fields have not changed significantly in recent time.    PHYSICAL EXAM: BP (!) 142/76 (BP Location: Left  Arm)   Pulse 95   Temp 98.8 F (37.1 C) (Oral)   Resp 20   Ht 5\' 3"  (1.6 m)   Wt 130 lb 6 oz (59.1 kg)   SpO2 98%   BMI 23.09 kg/m  Finkel slight cachectic male in NAD seen in his hospital bed. He is on nasal oxygen. He does have coarse rhonchi bilaterally in his lungs. Well-developed well-nourished patient in NAD. HEENT reveals PERLA, EOMI, discs not visualized.  Oral cavity is clear. No oral mucosal lesions are identified. Neck is clear without evidence of cervical or supraclavicular adenopathy. Lungs are clear to A&P. Cardiac examination is essentially unremarkable with regular rate and rhythm without murmur rub or thrill. Abdomen is benign with no organomegaly or masses noted. Motor sensory and DTR levels are equal and symmetric in the upper and lower extremities. Cranial nerves II through XII are grossly intact. Proprioception is intact. No peripheral adenopathy or edema is identified. No motor or sensory levels  are noted. Crude visual fields are within normal range.  LABORATORY DATA: Laboratory data reviewed    RADIOLOGY RESULTS: CT scan reviewed   IMPRESSION: Probable locally advanced small cell lung cancer in 56 year old male as yet not biopsied  PLAN: Present time patient is scheduled for biopsy possibly of supraclavicular lymph node. I would also anticipate at some point a PET CT scan being performed. Since the patient is determined to go to [facility] for care with family members working there I have given him my card and asked him if he desires treatment locally to contact our office. We'll continue to follow-up as his staging is complete.  I would like to take this opportunity to thank you for allowing me to participate in the care of your patient.Armstead Peaks., MD

## 2016-12-06 NOTE — Telephone Encounter (Signed)
Patient called and said that he has decided to take treatment here and would like for Dr Tasia Catchings to come back and speak with him or call him in room 108

## 2016-12-06 NOTE — Care Management Note (Signed)
Case Management Note  Patient Details  Name: Steven Davis MRN: [id number] Date of Birth: 1960-06-03  Subjective/Objective: Admitted to [facility] with the diagnosis of pneumonia, Lives with roommate [name]. Daughter is [name] [phone]). Brother is [name] [phone]). Seen Dr. Kym Groom last Tuesday. Prescriptions are filled at [facility] in [place]. No home Health. No skilled facility. No home oxygen. Uses a cane to aid in ambulation., takes care of all basic activities of daily living himself, drives. No falls. Lost 35 pounds in the last 6 months. Family will transport. [name] indicated that he would like to go to [facility] for his care            Action/Plan: Will be scheduled for lung biopsy today. NPO. Will continue to follow for discharge plans, if needed   Expected Discharge Date:                  Expected Discharge Plan:     In-House Referral:     Discharge planning Services     Post Acute Care Choice:    Choice offered to:     DME Arranged:    DME Agency:     HH Arranged:    [place] Agency:     Status of Service:     If discussed at [employer], dates discussed:    Additional Comments:  Shelbie Ammons, RN MSN CCM Care Management [phone] 12/06/2016, 9:44 AM

## 2016-12-06 NOTE — Progress Notes (Signed)
Hematology/Oncology Progress Note [facility] [phone] [phone]  Patient Care Team: [facility] as PCP - General Telford Nab, RN as Registered Nurse   Name of the patient: Steven Davis  [id number]  09/25/1960  Date of visit: 12/06/16  INTERVAL HISTORY Patient was seen and examined the bedside. He reports improvement of his breathing. He had a good night's sleep. Continue to have intermittent cough. He had CT and brain MRI done. He couldn't tolerate brain MRI.CT abdomen and pelvis CT abdomen and pelvis showed multiple hypodense liver lesions. He is also scheduled to have ultrasound-guided biopsy of his left supraclavicular lymph node.He tells me that he usually lies on his right side because of chronic back problem. He feels more comfortable breathing today.   Review of systems- Review of Systems  Constitutional: Positive for malaise/fatigue and weight loss. Negative for chills and fever.  HENT: Negative for hearing loss.   Eyes: Negative for blurred vision and photophobia.  Respiratory: Positive for cough, sputum production and shortness of breath. Negative for hemoptysis and wheezing.   Cardiovascular: Negative for chest pain and leg swelling.  Gastrointestinal: Negative for heartburn and vomiting.  Genitourinary: Negative for dysuria.  Musculoskeletal: Negative for myalgias.  Skin: Negative for rash.  Neurological: Negative for dizziness and headaches.  Endo/Heme/Allergies: Does not bruise/bleed easily.  Psychiatric/Behavioral: Negative for depression.    Allergies  Allergen Reactions  . Benadryl [Diphenhydramine Hcl (Sleep)] Nausea And Vomiting    Patient Active Problem List   Diagnosis Date Noted  . Dyspnea   . Lung mass   . HCAP (healthcare-associated pneumonia) 12/03/2016  . Anemia 11/21/2014  . Acute encephalopathy 06/06/2014  . Hyponatremia 06/06/2014  . HTN (hypertension) 06/06/2014  . GERD  (gastroesophageal reflux disease) 06/06/2014     Past Medical History:  Diagnosis Date  . Anemia   . Erectile dysfunction   . GERD (gastroesophageal reflux disease)   . Hypertension      Past Surgical History:  Procedure Laterality Date  . BACK SURGERY      Social History   Socioeconomic History  . Marital status: Single    Spouse name: Not on file  . Number of children: Not on file  . Years of education: Not on file  . Highest education level: Not on file  Social Needs  . Financial resource strain: Not on file  . Food insecurity - worry: Not on file  . Food insecurity - inability: Not on file  . Transportation needs - medical: Not on file  . Transportation needs - non-medical: Not on file  Occupational History  . Not on file  Tobacco Use  . Smoking status: Current Every Day Smoker    Packs/day: 1.00    Types: Cigarettes  . Smokeless tobacco: Never Used  Substance and Sexual Activity  . Alcohol use: Yes    Alcohol/week: 1.2 - 1.8 oz    Types: 2 - 3 Cans of beer per week  . Drug use: No  . Sexual activity: Not on file  Other Topics Concern  . Not on file  Social History Narrative  . Not on file     Family History  Problem Relation Age of Onset  . Hypertension Father      Current Facility-Administered Medications:  .  acetaminophen (TYLENOL) tablet 650 mg, 650 mg, Oral, Q6H PRN, Gouru, Aruna, MD .  albuterol (PROVENTIL) (2.5 MG/3ML) 0.083% nebulizer solution 3 mL, 3 mL, Inhalation, Q6H PRN, Nicholes Mango, MD .  amLODipine (NORVASC) tablet 10 mg, 10 mg, Oral, Daily, Gouru, Aruna, MD, 10 mg at 12/06/16 1059 .  ceFEPIme (MAXIPIME) 1 g in dextrose 5 % 50 mL IVPB, 1 g, Intravenous, Q8H, Gouru, Aruna, MD, Stopped at 12/06/16 2831 .  docusate sodium (COLACE) capsule 100 mg, 100 mg, Oral, BID PRN, Gouru, Aruna, MD, 100 mg at 12/03/16 2341 .  feeding supplement (ENSURE ENLIVE) (ENSURE ENLIVE) liquid 237 mL, 237 mL, Oral, BID BM, Gouru, Aruna, MD, 237 mL at 12/04/16  1325 .  fentaNYL (SUBLIMAZE) 100 MCG/2ML injection, , , ,  .  ferrous sulfate tablet 325 mg, 325 mg, Oral, Daily, Gouru, Aruna, MD, 325 mg at 12/06/16 1059 .  ipratropium-albuterol (DUONEB) 0.5-2.5 (3) MG/3ML nebulizer solution 3 mL, 3 mL, Nebulization, Q4H, Vaughan Basta, MD, 3 mL at 12/06/16 1131 .  lisinopril (PRINIVIL,ZESTRIL) tablet 40 mg, 40 mg, Oral, Daily, Gouru, Aruna, MD, 40 mg at 12/06/16 1059 .  MEDLINE mouth rinse, 15 mL, Mouth Rinse, BID, Gouru, Aruna, MD, 15 mL at 12/06/16 1100 .  methylPREDNISolone sodium succinate (SOLU-MEDROL) 125 mg/2 mL injection 60 mg, 60 mg, Intravenous, Q6H, Vaughan Basta, MD, 60 mg at 12/06/16 1418 .  midazolam (VERSED) 5 MG/5ML injection, , , ,  .  nicotine (NICODERM CQ - dosed in mg/24 hours) patch 21 mg, 21 mg, Transdermal, Daily, Gouru, Aruna, MD, 21 mg at 12/06/16 1059 .  ondansetron (ZOFRAN) injection 4 mg, 4 mg, Intravenous, Q6H PRN, Gouru, Aruna, MD .  oxyCODONE (Oxy IR/ROXICODONE) immediate release tablet 5 mg, 5 mg, Oral, Q6H PRN, Vaughan Basta, MD, 5 mg at 12/06/16 1429 .  oxyCODONE (OXYCONTIN) 12 hr tablet 10 mg, 10 mg, Oral, BID, Gouru, Aruna, MD, 10 mg at 12/06/16 1059 .  pantoprazole (PROTONIX) EC tablet 40 mg, 40 mg, Oral, Daily, Gouru, Aruna, MD, 40 mg at 12/06/16 1059   Physical exam:  Vitals:   12/06/16 1500 12/06/16 1508 12/06/16 1510 12/06/16 1511  BP:  (!) 144/81  (!) 145/89  Pulse: (!) 102 (!) 105 (!) 108   Resp: 20 (!) 22 17   Temp:      TempSrc:      SpO2: 95% 95% 98%   Weight:      Height:        GENERAL: No distress, well nourished.  SKIN:  No rashes or significant lesions  HEAD: Normocephalic, No masses, lesions, tenderness or abnormalities  EYES: Conjunctiva are pink, non icteric ENT: External ears normal ,lips , buccal mucosa, and tongue normal   LYMPH: palpable left supraclavicular lymphadenopathy. LUNGS:  decreased breath sound. No wheezing today. Tachypnic, on nasal cannula  oxygen HEART: Regular rate & rhythm, no murmurs, no gallops, S1 normal and S2 normal  ABDOMEN: Abdomen soft, non-tender, normal bowel sounds,  MUSCULOSKELETAL: No CVA tenderness.. cushion pads along his spine. EXTREMITIES: No edema, no skin discoloration or tenderness NEURO: Alert & oriented, no focal motor/sensory deficits.      CMP Latest Ref Rng & Units 12/05/2016  Glucose 65 - 99 mg/dL 141(H)  BUN 6 - 20 mg/dL 10  Creatinine 0.61 - 1.24 mg/dL 0.32(L)  Sodium 135 - 145 mmol/L 130(L)  Potassium 3.5 - 5.1 mmol/L 3.6  Chloride 101 - 111 mmol/L 90(L)  CO2 22 - 32 mmol/L 28  Calcium 8.9 - 10.3 mg/dL 9.1  Total Protein 6.5 - 8.1 g/dL -  Total Bilirubin 0.3 - 1.2 mg/dL -  Alkaline Phos 38 - 126 U/L -  AST 15 - 41 U/L -  ALT 17 -  63 U/L -   CBC Latest Ref Rng & Units 12/05/2016  WBC 3.8 - 10.6 K/uL 8.3  Hemoglobin 13.0 - 18.0 g/dL 13.3  Hematocrit 40.0 - 52.0 % 39.2(L)  Platelets 150 - 440 K/uL 350      Dg Chest 2 View  Result Date: 12/05/2016 CLINICAL DATA:  Shortness of breath for 2-3 days.  Pulmonary mass. EXAM: CHEST  2 VIEW COMPARISON:  Chest radiograph 12/03/2017 FINDINGS: The cardiomediastinal silhouette is distorted by a known left pulmonary mass. Fullness of bilateral hilar regions. There diffusely increased interstitial markings, right more than left. There has been interval development of probable right pleural effusion. No evidence of pneumothorax. Bilateral apical pleural thickening. Stable spinal fusion across moderate lower thoracic level compression fracture. Lower cervical fusion noted. Soft tissues are grossly normal. IMPRESSION: Interval development of increased interstitial markings, which may be seen with interstitial pulmonary edema or hemorrhage. Interval development of right pleural effusion. Known left pulmonary mass. Electronically Signed   By: Fidela Salisbury M.D.   On: 12/05/2016 07:48   Ct Chest W Contrast  Result Date: 11/23/2016 CLINICAL DATA:   Cough, loss of voice for several months, malaise EXAM: CT CHEST WITH CONTRAST TECHNIQUE: Multidetector CT imaging of the chest was performed during intravenous contrast administration. CONTRAST:  26mL ISOVUE-300 IOPAMIDOL (ISOVUE-300) INJECTION 61% COMPARISON:  Chest x-ray of 11/21/2014 FINDINGS: Cardiovascular: The heart is within normal limits in size. Diffuse coronary artery calcifications are present. Moderate abdominal aortic atherosclerosis is noted. The mid ascending thoracic aorta measures 34 mm in diameter. Mediastinum/Nodes: There is massive mediastinal and left hilar adenopathy present. A subcarinal node measures 29 mm in diameter. A right pretracheal node measures 28 mm in diameter within anterior prevascular node of 29 mm in diameter. Left hilar nodes are difficult to measure accurately due to volume loss. Adenopathy extends in the left paratracheal, left supraclavicular region, and left lower neck as well. Lungs/Pleura: There is a large rounded mass within much of the left lower lobe posterior medially. On image 81 series 2 this mass measures 8.4 x 11.8 cm with left hilar and mediastinal extension. This is consistent with a large left lower lobe carcinoma. Multiple primarily ground-glass nodules are throughout the left lung consistent with ipsilateral metastases. No definite right lung nodule is seen. No pleural effusion is noted. This mass causes extrinsic compression and narrowing of the left main stem bronchus as well as the bronchus to the left lower lobe and lingula. Upper Abdomen: There is suspicion of a pathologically enlarged left periaortic node on the last image, 169 series 2. No hepatic lesion is seen. Small noncalcified gallstones and/or gallbladder debris layer within the gallbladder. Moderate abdominal aortic atherosclerosis is noted. Musculoskeletal: Hardware for fusion of the lower cervical and lower thoracic - upper lumbar spine is noted no lytic or blastic bony lesion is seen with  certainty. IMPRESSION: 1. Large mass occupies much of the left lower lobe with mediastinal and left hilar extension consistent with primary lung carcinoma. 2. Poorly defined ground-glass opacities within the left lung most consistent with ipsilateral lung metastases. 3. Diffuse coronary artery calcifications and changes of abdominal aortic atherosclerosis. 4. Gallbladder sludge and/or gallstones. Electronically Signed   By: Ivar Drape M.D.   On: 11/23/2016 12:13   Mr Brain Wo Contrast  Result Date: 12/05/2016 CLINICAL DATA:  Small cell lung cancer staging EXAM: MRI HEAD WITHOUT CONTRAST TECHNIQUE: Multiplanar, multiecho pulse sequences of the brain and surrounding structures were obtained without intravenous contrast. COMPARISON:  CT 06/05/2014  FINDINGS: Limited study. The patient was not able to hold still due to extreme discomfort. Limited imaging. No contrast administered Axial diffusion-weighted imaging is diagnostic. No acute infarct. No areas of restricted diffusion. Ventricle size normal.  No mass or fluid collection identified. IMPRESSION: Limited study. The patient was not able to complete the study and minimal imaging was performed. Allowing for this no significant abnormality was identified. Electronically Signed   By: Franchot Gallo M.D.   On: 12/05/2016 13:24   Ct Abdomen Pelvis W Contrast  Result Date: 12/05/2016 CLINICAL DATA:  History of lung cancer. Shortness-of-breath with nonproductive cough and hoarseness. Oxygen dependent. Heavy smoker. EXAM: CT ABDOMEN AND PELVIS WITH CONTRAST TECHNIQUE: Multidetector CT imaging of the abdomen and pelvis was performed using the standard protocol following bolus administration of intravenous contrast. CONTRAST:  185mL ISOVUE-300 IOPAMIDOL (ISOVUE-300) INJECTION 61% COMPARISON:  Chest CT 11/23/2016 FINDINGS: Lower chest: No significant change in known large left lower lobe lung mass presumed to be primary bronchogenic carcinoma. Adjacent small nodules  within the left lower lobe likely metastatic disease. Patchy bilateral nodular airspace process new since the previous exam as the acuteness suggests infection and much less likely progression of patient's neoplastic disease. Small amount of bilateral pleural fluid right greater than left which is new. Calcified plaque over the left anterior descending and right coronary arteries. Hepatobiliary: 7 small hypodense liver lesions most not well seen on prior exam as the largest is over the right lobe measuring 1.7 cm. This is likely due to metastatic disease. Minimal sludge versus cholelithiasis. Biliary tree is within normal. Pancreas: Within normal. Spleen: Within normal. Adrenals/Urinary Tract: Adrenal glands are normal. Kidneys normal in size without hydronephrosis or nephrolithiasis. Ureters and bladder are normal. Stomach/Bowel: Stomach and small bowel are unremarkable. Appendix is normal. Moderate fecal retention throughout the colon. Vascular/Lymphatic: Moderate calcified plaque over the abdominal aorta and iliac arteries. No definite adenopathy within the abdomen/pelvis. Reproductive: Within normal. Other: No free fluid or focal inflammatory change. Musculoskeletal: Moderate spondylosis of the spine with curvature of the thoracolumbar spine convex right and multilevel disc disease. Fusion hardware is present throughout the visualized thoracolumbar spine which is intact and unchanged. Multiple stable compression fractures over the visualized thoracolumbar spine. IMPRESSION: Stable partially visualized large left lower lobe mass with several small adjacent nodules likely adjacent metastatic disease. Approximately 7 small hypodense liver lesions not well seen on the prior exam with the largest 1.7 cm over the right lobe compatible with metastatic disease. New patchy nodular airspace process over the mid to lower lungs right worse than left as the q.d. suggest infection and much less likely progression of  neoplastic disease. New small bilateral pleural effusions right greater than left. Aortic Atherosclerosis (ICD10-I70.0). Atherosclerotic coronary artery disease. Minimal sludge versus cholelithiasis. Moderate spondylosis of the spine with multilevel disc disease and multiple stable compression fractures. Posterior fusion hardware intact over the thoracolumbar spine. Electronically Signed   By: Marin Olp M.D.   On: 12/05/2016 14:24   Dg Chest Portable 1 View  Result Date: 12/03/2016 CLINICAL DATA:  Worsening shortness of breath over the past 6 weeks in patient with a left lower lobe mass. EXAM: PORTABLE CHEST 1 VIEW COMPARISON:  CT chest 11/23/2016. Single-view of the chest with 11/21/2014. FINDINGS: Extensive perihilar airspace disease is present bilaterally. Bulky mediastinal lymphadenopathy is identified in the left lower lobe mass is noted but better visualized on prior CT. Heart size is normal. No pneumothorax or pleural effusion. IMPRESSION: Extensive perihilar airspace disease could be  due to pneumonia or edema. Bulky mediastinal lymphadenopathy and left lower lobe mass as seen on prior CT. Electronically Signed   By: Inge Rise M.D.   On: 12/03/2016 09:50   [place] Core Biopsy (lymph Nodes)  Result Date: 12/06/2016 INDICATION: Known left lower lobe lung mass with supraclavicular lymphadenopathy. EXAM: ULTRASOUND-GUIDED LEFT SUPRACLAVICULAR LYMPH NODE BIOPSY. MEDICATIONS: None. ANESTHESIA/SEDATION: None FLUOROSCOPY TIME:  Not applicable COMPLICATIONS: None immediate. PROCEDURE: Informed written consent was obtained from the patient after a thorough discussion of the procedural risks, benefits and alternatives. All questions were addressed. Maximal Sterile Barrier Technique was utilized including caps, mask, sterile gowns, sterile gloves, sterile drape, hand hygiene and skin antiseptic. A timeout was performed prior to the initiation of the procedure. Utilizing real-time ultrasound guidance and 1%  xylocaine as local anesthetic a 17 gauge guiding needle was placed percutaneously into 1 of the left supraclavicular lymph nodes. Multiple 18 gauge core biopsies were then obtained and sent for pathologic evaluation. Dedicated ultrasound imaging was obtained during the needle passes. Guiding needle was then removed and hemostasis obtained at the puncture site. Patient tolerated the procedure well was returned his room in satisfactory condition. IMPRESSION: Successful ultrasound-guided left supraclavicular lymph node biopsy Electronically Signed   By: Inez Catalina M.D.   On: 12/06/2016 15:47    Assessment and plan-  Patient is a 56 y.o. male presented with for evaluation of shortness of breath, associated withcough, hoarseness.outpatient CT chest showed left lung mass.  # left lung mass with mediastinal lymphadenopathy as well as left supraclavicular lymphadenopathy.  Status post CT guided biopsy of left supraclavicular node, awaiting pathology # CT abdomen w contrast reviewed, concerning for metastatic disease in the liver.  MRI brain cannot be completed as patient cannot hold still. # Respiratory stress likely secondary to pneumonia and narrowing of left main stem bronchus by extrinsic compression. He was seen by Rad Onc.   Patient initially tells me that he wants to establish care with [facility] oncology as his son-in-law works in the [facility] system. However he hasn't made any appointment yet. I advised patient to call his son-in-law today and get scheduled to see oncology there as soon as possible. Patient later called cancer center and left a message. I called him back, and the patient tells me that he wanted to have his cancer care here at [place]. Lung cancer nurse navigator Hildred Alamin is notified and will coordinate patient's outpatient PET scan, possible ultrasound-guided liver biopsy to prove distant metastasis. We'll also need to send foundation one test+ PDL 1 testing. Patient can follow-up  with me outpatient.   # Acute respiratory failure secondary to COPD exacerbation/ pneumonia /underlying lung cancer.  Improving with IV antibiotics, as well as breathing treatment and steroids.  Thank you for this kind referral and the opportunity to participate in the care of this patient  Earlie Server, MD, PhD [facility] Pager- [phone] 12/06/2016

## 2016-12-06 NOTE — Progress Notes (Signed)
[facility] [place] NAME: Steven Davis    MR#:  [id number]  DATE OF BIRTH:  January 30, 1960  SUBJECTIVE:  CHIEF COMPLAINT:   Chief Complaint  Patient presents with  . Shortness of Breath     Very SOB, and In pain.   reepatedly treated with Abx for Respi infections in last 3 weeks. Finally CT chest showed a mass- likely cancer in lung, 2 days ago.  feels better now. Still on 4 ltr oxygen.  Had good sleep in night.  REVIEW OF SYSTEMS:  CONSTITUTIONAL: No fever, fatigue or weakness.  EYES: No blurred or double vision.  EARS, NOSE, AND THROAT: No tinnitus or ear pain.  RESPIRATORY: positive for cough, shortness of breath, no wheezing or hemoptysis.  CARDIOVASCULAR: No chest pain, orthopnea, edema.  GASTROINTESTINAL: No nausea, vomiting, diarrhea or abdominal pain.  GENITOURINARY: No dysuria, hematuria.  ENDOCRINE: No polyuria, nocturia,  HEMATOLOGY: No anemia, easy bruising or bleeding SKIN: No rash or lesion. MUSCULOSKELETAL: No joint pain or arthritis.   NEUROLOGIC: No tingling, numbness, weakness.  PSYCHIATRY: No anxiety or depression.   ROS  DRUG ALLERGIES:   Allergies  Allergen Reactions  . Benadryl [Diphenhydramine Hcl (Sleep)] Nausea And Vomiting    VITALS:  Blood pressure (!) 144/90, pulse 94, temperature 98.5 F (36.9 C), temperature source Oral, resp. rate 20, height 5\' 3"  (1.6 m), weight 59.1 kg (130 lb 6 oz), SpO2 97 %.  PHYSICAL EXAMINATION:  GENERAL:  56 y.o.-year-old patient lying in the bed with no acute distress.  EYES: Pupils equal, round, reactive to light and accommodation. No scleral icterus. Extraocular muscles intact.  HEENT: Head atraumatic, normocephalic. Oropharynx and nasopharynx clear.  NECK:  Supple, no jugular venous distention. No thyroid enlargement, no tenderness.  LUNGS: Normal breath sounds bilaterally, no wheezing, b/l crepitation. Positive for use of accessory muscles of respiration. On 4 ltr  oxygen. CARDIOVASCULAR: S1, S2 normal. No murmurs, rubs, or gallops.  ABDOMEN: Soft, nontender, nondistended. Bowel sounds present. No organomegaly or mass.    Surgical marks on back. EXTREMITIES: No pedal edema, cyanosis, or clubbing.  NEUROLOGIC: Cranial nerves II through XII are intact. Muscle strength 5/5 in all extremities. Sensation intact. Gait not checked.  PSYCHIATRIC: The patient is alert and oriented x 3. Anxious and in pain. SKIN: No obvious rash, lesion, or ulcer.   Physical Exam LABORATORY PANEL:   CBC Recent Labs  Lab 12/05/16 0453  WBC 8.3  HGB 13.3  HCT 39.2*  PLT 350   ------------------------------------------------------------------------------------------------------------------  Chemistries  Recent Labs  Lab 12/04/16 0548 12/05/16 0453  NA  --  130*  K  --  3.6  CL  --  90*  CO2  --  28  GLUCOSE  --  141*  BUN  --  10  CREATININE 0.39* 0.32*  CALCIUM  --  9.1  MG 1.6*  --    ------------------------------------------------------------------------------------------------------------------  Cardiac Enzymes Recent Labs  Lab 12/03/16 0933  TROPONINI 0.04*   ------------------------------------------------------------------------------------------------------------------  RADIOLOGY:  Dg Chest 2 View  Result Date: 12/05/2016 CLINICAL DATA:  Shortness of breath for 2-3 days.  Pulmonary mass. EXAM: CHEST  2 VIEW COMPARISON:  Chest radiograph 12/03/2017 FINDINGS: The cardiomediastinal silhouette is distorted by a known left pulmonary mass. Fullness of bilateral hilar regions. There diffusely increased interstitial markings, right more than left. There has been interval development of probable right pleural effusion. No evidence of pneumothorax. Bilateral apical pleural thickening. Stable spinal fusion across moderate lower thoracic level  compression fracture. Lower cervical fusion noted. Soft tissues are grossly normal. IMPRESSION: Interval development  of increased interstitial markings, which may be seen with interstitial pulmonary edema or hemorrhage. Interval development of right pleural effusion. Known left pulmonary mass. Electronically Signed   By: Fidela Salisbury M.D.   On: 12/05/2016 07:48   Mr Brain Wo Contrast  Result Date: 12/05/2016 CLINICAL DATA:  Small cell lung cancer staging EXAM: MRI HEAD WITHOUT CONTRAST TECHNIQUE: Multiplanar, multiecho pulse sequences of the brain and surrounding structures were obtained without intravenous contrast. COMPARISON:  CT 06/05/2014 FINDINGS: Limited study. The patient was not able to hold still due to extreme discomfort. Limited imaging. No contrast administered Axial diffusion-weighted imaging is diagnostic. No acute infarct. No areas of restricted diffusion. Ventricle size normal.  No mass or fluid collection identified. IMPRESSION: Limited study. The patient was not able to complete the study and minimal imaging was performed. Allowing for this no significant abnormality was identified. Electronically Signed   By: Franchot Gallo M.D.   On: 12/05/2016 13:24   Ct Abdomen Pelvis W Contrast  Result Date: 12/05/2016 CLINICAL DATA:  History of lung cancer. Shortness-of-breath with nonproductive cough and hoarseness. Oxygen dependent. Heavy smoker. EXAM: CT ABDOMEN AND PELVIS WITH CONTRAST TECHNIQUE: Multidetector CT imaging of the abdomen and pelvis was performed using the standard protocol following bolus administration of intravenous contrast. CONTRAST:  156mL ISOVUE-300 IOPAMIDOL (ISOVUE-300) INJECTION 61% COMPARISON:  Chest CT 11/23/2016 FINDINGS: Lower chest: No significant change in known large left lower lobe lung mass presumed to be primary bronchogenic carcinoma. Adjacent small nodules within the left lower lobe likely metastatic disease. Patchy bilateral nodular airspace process new since the previous exam as the acuteness suggests infection and much less likely progression of patient's  neoplastic disease. Small amount of bilateral pleural fluid right greater than left which is new. Calcified plaque over the left anterior descending and right coronary arteries. Hepatobiliary: 7 small hypodense liver lesions most not well seen on prior exam as the largest is over the right lobe measuring 1.7 cm. This is likely due to metastatic disease. Minimal sludge versus cholelithiasis. Biliary tree is within normal. Pancreas: Within normal. Spleen: Within normal. Adrenals/Urinary Tract: Adrenal glands are normal. Kidneys normal in size without hydronephrosis or nephrolithiasis. Ureters and bladder are normal. Stomach/Bowel: Stomach and small bowel are unremarkable. Appendix is normal. Moderate fecal retention throughout the colon. Vascular/Lymphatic: Moderate calcified plaque over the abdominal aorta and iliac arteries. No definite adenopathy within the abdomen/pelvis. Reproductive: Within normal. Other: No free fluid or focal inflammatory change. Musculoskeletal: Moderate spondylosis of the spine with curvature of the thoracolumbar spine convex right and multilevel disc disease. Fusion hardware is present throughout the visualized thoracolumbar spine which is intact and unchanged. Multiple stable compression fractures over the visualized thoracolumbar spine. IMPRESSION: Stable partially visualized large left lower lobe mass with several small adjacent nodules likely adjacent metastatic disease. Approximately 7 small hypodense liver lesions not well seen on the prior exam with the largest 1.7 cm over the right lobe compatible with metastatic disease. New patchy nodular airspace process over the mid to lower lungs right worse than left as the q.d. suggest infection and much less likely progression of neoplastic disease. New small bilateral pleural effusions right greater than left. Aortic Atherosclerosis (ICD10-I70.0). Atherosclerotic coronary artery disease. Minimal sludge versus cholelithiasis. Moderate  spondylosis of the spine with multilevel disc disease and multiple stable compression fractures. Posterior fusion hardware intact over the thoracolumbar spine. Electronically Signed   By: Marin Olp  M.D.   On: 12/05/2016 14:24    ASSESSMENT AND PLAN:   Active Problems:   HCAP (healthcare-associated pneumonia)   Dyspnea   Lung mass   #Acute hypoxemic respiratory failure secondary to healthcare associated pneumonia and underlying bronchogenic carcinoma Mid to MedSurg unit Broad spectrum IV antibiotics with cefepime and vancomycin Bronchodilator treatments Sputum culture and sensitivity Wean off oxygen as tolerated  May need oxygen on discharge.  # bacteria in one blood culture.   Looks contamination.   Follow results.   Already on vanc.   ID consult.  #Essential hypertension Continue home medication Norvasc and lisinopril and titrate as needed  # new findings of left lung mass-bronchogenic carcinoma Called Inpatient consult to have further diagnostics.  Ordered CT abd, MRI brain for mets and  radiology to do Biopsy of Supraclavicular nodes.   Also noted to have some lesions in liver.   Radiation oncology to see the pt also as the mass seems compressing on left bronchus.  # GERD PPI  # Tobacco abuse disorder Counseled patient to quit smoking for 5 minutes.  He is agreeable with nicotine patch   All the records are reviewed and case discussed with Care Management/Social Workerr. Management plans discussed with the patient, family and they are in agreement.  CODE STATUS: full  TOTAL TIME TAKING CARE OF THIS PATIENT: 35 minutes.   Pt's brother is in room during my visit.  POSSIBLE D/C IN 2-3 DAYS, DEPENDING ON CLINICAL CONDITION.   Vaughan Basta M.D on 12/06/2016   Between 7am to 6pm - Pager - [phone]  After 6pm go to [email or web address] - password [email or web address] [facility]  Office  [phone]  CC: Primary care physician; [facility]  Note: This dictation was prepared with Dragon dictation along with smaller phrase technology. Any transcriptional errors that result from this process are unintentional.

## 2016-12-06 NOTE — Progress Notes (Signed)
Pharmacy Antibiotic Note  Steven Davis is a 56 y.o. male admitted on 12/03/2016 with pneumonia.  Pharmacy has been consulted for vancomycin dosing.   BCID staph species, ID has been consulted. Patient remains on vancomycin.  Plan: VT = 6 mcg/mL obtained last night is subtherapeutic. Will increase regimen to vancomycin 750 mg IV q8h. Goal VT 15-20 mcg/mL Will check trough prior to 5th dose which should represent steady state.  Height: 5\' 3"  (160 cm) Weight: 130 lb 6 oz (59.1 kg) IBW/kg (Calculated) : 56.9  Temp (24hrs), Avg:98.4 F (36.9 C), Min:97.8 F (36.6 C), Max:98.8 F (37.1 C)  Recent Labs  Lab 12/03/16 0933 12/04/16 0548 12/05/16 0453 12/05/16 2159  WBC 7.8  --  8.3  --   CREATININE 0.54* 0.39* 0.32*  --   VANCOTROUGH  --   --   --  6*    Estimated Creatinine Clearance: 82 mL/min (A) (by C-G formula based on SCr of 0.32 mg/dL (L)).    Allergies  Allergen Reactions  . Benadryl [Diphenhydramine Hcl (Sleep)] Nausea And Vomiting    Antimicrobials this admission: cefepime 11/9 >>  vancomycin 11/9 >>   Dose adjustments this admission:  Microbiology results: 11/9 BCx: Staph epidermidis, staph hominis 11/9 Sputum: Pending  11/9 MRSA PCR: Negative  Thank you for allowing pharmacy to be a part of this patient's care.  Lenis Noon, PharmD, BCPS Clinical Pharmacist 12/06/2016 1:46 PM

## 2016-12-06 NOTE — Consult Note (Signed)
[facility] Infectious Disease     Reason for Consult: Bacteremia, PNA  Referring Physician:Vachani, V Date of Admission:  12/03/2016   Active Problems:   HCAP (healthcare-associated pneumonia)   Dyspnea   Lung mass   HPI: Steven Davis is a 56 y.o. male admitted with shortness of breath in the setting of recent diagnosis of a lung mass highly suspicious for bronchogenic carcinoma.  He been treated for the last several weeks with antibiotics for possible pneumonia including Augmentin.  On admission his white blood count was 7.8.  He was afebrile.  Blood cultures have grown 2 different forms of coag negative staph.  CT imaging shows large left lower lobe mass with several adjacent nodules likely metastatic disease as well as probable liver metastases.  If there is also patchy nodular disease in the lungs.  He has been on cefepime and Zosyn since admission and had received 1 dose of levofloxacin on November 9.  Clinically improving.  Sputum cultures pending.  Past Medical History:  Diagnosis Date  . Anemia   . Erectile dysfunction   . GERD (gastroesophageal reflux disease)   . Hypertension    Past Surgical History:  Procedure Laterality Date  . BACK SURGERY     Social History   Tobacco Use  . Smoking status: Current Every Day Smoker    Packs/day: 1.00    Types: Cigarettes  . Smokeless tobacco: Never Used  Substance Use Topics  . Alcohol use: Yes    Alcohol/week: 1.2 - 1.8 oz    Types: 2 - 3 Cans of beer per week  . Drug use: No   Family History  Problem Relation Age of Onset  . Hypertension Father     Allergies:  Allergies  Allergen Reactions  . Benadryl [Diphenhydramine Hcl (Sleep)] Nausea And Vomiting    Current antibiotics: Antibiotics Given (last 72 hours)    Date/Time Action Medication Dose Rate   12/03/16 1415 New Bag/Given   piperacillin-tazobactam (ZOSYN) IVPB 3.375 g 3.375 g 100 mL/hr   12/03/16 1620 New Bag/Given   ceFEPIme (MAXIPIME) 1 g in  dextrose 5 % 50 mL IVPB 1 g 100 mL/hr   12/03/16 1706 New Bag/Given   vancomycin (VANCOCIN) IVPB 1000 mg/200 mL premix 1,000 mg 200 mL/hr   12/03/16 2341 New Bag/Given   vancomycin (VANCOCIN) IVPB 750 mg/150 ml premix 750 mg 150 mL/hr   12/04/16 0200 New Bag/Given   ceFEPIme (MAXIPIME) 1 g in dextrose 5 % 50 mL IVPB 1 g 100 mL/hr   12/04/16 0857 New Bag/Given   ceFEPIme (MAXIPIME) 1 g in dextrose 5 % 50 mL IVPB 1 g 100 mL/hr   12/04/16 1102 New Bag/Given   vancomycin (VANCOCIN) IVPB 750 mg/150 ml premix 750 mg 150 mL/hr   12/04/16 1624 New Bag/Given   ceFEPIme (MAXIPIME) 1 g in dextrose 5 % 50 mL IVPB 1 g 100 mL/hr   12/04/16 2220 New Bag/Given   vancomycin (VANCOCIN) IVPB 750 mg/150 ml premix 750 mg 150 mL/hr   12/05/16 0101 New Bag/Given   ceFEPIme (MAXIPIME) 1 g in dextrose 5 % 50 mL IVPB 1 g 100 mL/hr   12/05/16 1030 New Bag/Given   ceFEPIme (MAXIPIME) 1 g in dextrose 5 % 50 mL IVPB 1 g 100 mL/hr   12/05/16 1341 New Bag/Given   vancomycin (VANCOCIN) IVPB 750 mg/150 ml premix 750 mg 150 mL/hr   12/05/16 1621 New Bag/Given   ceFEPIme (MAXIPIME) 1 g in dextrose 5 % 50 mL IVPB 1  g 100 mL/hr   12/05/16 2219 New Bag/Given   vancomycin (VANCOCIN) IVPB 750 mg/150 ml premix 750 mg 150 mL/hr   12/05/16 2333 New Bag/Given   ceFEPIme (MAXIPIME) 1 g in dextrose 5 % 50 mL IVPB 1 g 100 mL/hr   12/06/16 0737 New Bag/Given   ceFEPIme (MAXIPIME) 1 g in dextrose 5 % 50 mL IVPB 1 g 100 mL/hr   12/06/16 1059 New Bag/Given   vancomycin (VANCOCIN) IVPB 750 mg/150 ml premix 750 mg 150 mL/hr      MEDICATIONS: . amLODipine  10 mg Oral Daily  . feeding supplement (ENSURE ENLIVE)  237 mL Oral BID BM  . ferrous sulfate  325 mg Oral Daily  . ipratropium-albuterol  3 mL Nebulization Q4H  . lisinopril  40 mg Oral Daily  . mouth rinse  15 mL Mouth Rinse BID  . methylPREDNISolone (SOLU-MEDROL) injection  60 mg Intravenous Q6H  . nicotine  21 mg Transdermal Daily  . oxyCODONE  10 mg Oral BID  .  pantoprazole  40 mg Oral Daily    Review of Systems - 11 systems reviewed and negative per HPI   OBJECTIVE: Temp:  [97.8 F (36.6 C)-98.5 F (36.9 C)] 98.5 F (36.9 C) (11/11 1933) Pulse Rate:  [93-112] 94 (11/12 1057) Resp:  [20] 20 (11/12 0435) BP: (144-162)/(82-95) 144/90 (11/12 1057) SpO2:  [91 %-97 %] 97 % (11/12 0435) Physical Exam  Constitutional: He is oriented to person, place, and time.thin, on O2.  HENT: anicteric Mouth/Throat: Oropharynx is clear and moist. No oropharyngeal exudate.  Cardiovascular: Normal rate, regular rhythm and normal heart sounds. Exam reveals no gallop and no friction rub.  No murmur heard.  Pulmonary/Chest: bil rhonchi Abdominal: Soft. Bowel sounds are normal. He exhibits no distension. There is no tenderness.  Lymphadenopathy: He has no cervical adenopathy.  Neurological: He is alert and oriented to person, place, and time.  Skin: Skin is warm and dry. No rash noted. No erythema.  Psychiatric: He has a normal mood and affect. His behavior is normal.   LABS: Results for orders placed or performed during the hospital encounter of 12/03/16 (from the past 48 hour(s))  Strep pneumoniae urinary antigen     Status: None   Collection Time: 12/04/16  1:41 PM  Result Value Ref Range   Strep Pneumo Urinary Antigen NEGATIVE NEGATIVE    Comment:        Infection due to S. pneumoniae cannot be absolutely ruled out since the antigen present may be below the detection limit of the test. Performed at [facility], [address], [place], [place] [address]   Culture, sputum-assessment     Status: None   Collection Time: 12/04/16 11:34 PM  Result Value Ref Range   Specimen Description SPU    Special Requests NONE    Sputum evaluation THIS SPECIMEN IS ACCEPTABLE FOR SPUTUM CULTURE    Report Status 12/05/2016 FINAL   Culture, respiratory (NON-Expectorated)     Status: None (Preliminary result)   Collection Time: 12/04/16 11:34 PM  Result Value  Ref Range   Specimen Description SPU    Special Requests NONE Reflexed from [id number]    Gram Stain      FEW WBC PRESENT, PREDOMINANTLY MONONUCLEAR RARE SQUAMOUS EPITHELIAL CELLS PRESENT RARE GRAM POSITIVE COCCI IN PAIRS Performed at [facility], [place] [address], [place],  [address]    Culture PENDING    Report Status PENDING   CBC     Status:  Abnormal   Collection Time: 12/05/16  4:53 AM  Result Value Ref Range   WBC 8.3 3.8 - 10.6 K/uL   RBC 4.13 (L) 4.40 - 5.90 MIL/uL   Hemoglobin 13.3 13.0 - 18.0 g/dL   HCT 39.2 (L) 40.0 - 52.0 %   MCV 94.8 80.0 - 100.0 fL   MCH 32.1 26.0 - 34.0 pg   MCHC 33.8 32.0 - 36.0 g/dL   RDW 15.6 (H) 11.5 - 14.5 %   Platelets 350 150 - 440 K/uL  Basic metabolic panel     Status: Abnormal   Collection Time: 12/05/16  4:53 AM  Result Value Ref Range   Sodium 130 (L) 135 - 145 mmol/L   Potassium 3.6 3.5 - 5.1 mmol/L   Chloride 90 (L) 101 - 111 mmol/L   CO2 28 22 - 32 mmol/L   Glucose, Bld 141 (H) 65 - 99 mg/dL   BUN 10 6 - 20 mg/dL   Creatinine, Ser 0.32 (L) 0.61 - 1.24 mg/dL   Calcium 9.1 8.9 - 10.3 mg/dL   GFR calc non Af Amer >60 >60 mL/min   GFR calc Af Amer >60 >60 mL/min    Comment: (NOTE) The eGFR has been calculated using the CKD EPI equation. This calculation has not been validated in all clinical situations. eGFR's persistently <60 mL/min signify possible Chronic Kidney Disease.    Anion gap 12 5 - 15  Vancomycin, trough     Status: Abnormal   Collection Time: 12/05/16  9:59 PM  Result Value Ref Range   Vancomycin Tr 6 (L) 15 - 20 ug/mL  Protime-INR     Status: None   Collection Time: 12/06/16  4:44 AM  Result Value Ref Range   Prothrombin Time 13.2 11.4 - 15.2 seconds   INR 1.01    No components found for: ESR, C REACTIVE PROTEIN MICRO: Recent Results (from the past 720 hour(s))  Blood culture (routine x 2)     Status: Abnormal   Collection Time: 12/03/16 10:00 AM  Result Value Ref Range Status   Specimen  Description BLOOD RIGHT ARM  Final   Special Requests BOTTLES DRAWN AEROBIC AND ANAEROBIC BCHV  Final   Culture  Setup Time   Final    AEROBIC BOTTLE ONLY GRAM POSITIVE COCCI CRITICAL RESULT CALLED TO, READ BACK BY AND VERIFIED WITH: CHRISTINE KATSOUDAS ON 12/04/16 AT 0730 QSD    Culture (A)  Final    STAPHYLOCOCCUS HOMINIS THE SIGNIFICANCE OF ISOLATING THIS ORGANISM FROM A SINGLE SET OF BLOOD CULTURES WHEN MULTIPLE SETS ARE DRAWN IS UNCERTAIN. PLEASE NOTIFY THE MICROBIOLOGY DEPARTMENT WITHIN ONE WEEK IF SPECIATION AND SENSITIVITIES ARE REQUIRED. Performed at [facility], [place] [address] [place], [place] [address]    Report Status 12/06/2016 FINAL  Final  Blood Culture ID Panel (Reflexed)     Status: Abnormal   Collection Time: 12/03/16 10:00 AM  Result Value Ref Range Status   Enterococcus species NOT DETECTED NOT DETECTED Final   Listeria monocytogenes NOT DETECTED NOT DETECTED Final   Staphylococcus species DETECTED (A) NOT DETECTED Final    Comment: Methicillin (oxacillin) susceptible coagulase negative staphylococcus. Possible blood culture contaminant (unless isolated from more than one blood culture draw or clinical case suggests pathogenicity). No antibiotic treatment is indicated for blood  culture contaminants. CRITICAL RESULT CALLED TO, READ BACK BY AND VERIFIED WITH: Goldsmith ON 12/04/16 AT 0730 QSD    Staphylococcus aureus NOT DETECTED NOT DETECTED Final   Methicillin resistance  NOT DETECTED NOT DETECTED Final   Streptococcus species NOT DETECTED NOT DETECTED Final   Streptococcus agalactiae NOT DETECTED NOT DETECTED Final   Streptococcus pneumoniae NOT DETECTED NOT DETECTED Final   Streptococcus pyogenes NOT DETECTED NOT DETECTED Final   Acinetobacter baumannii NOT DETECTED NOT DETECTED Final   Enterobacteriaceae species NOT DETECTED NOT DETECTED Final   Enterobacter cloacae complex NOT DETECTED NOT DETECTED Final   Escherichia coli NOT DETECTED NOT  DETECTED Final   Klebsiella oxytoca NOT DETECTED NOT DETECTED Final   Klebsiella pneumoniae NOT DETECTED NOT DETECTED Final   Proteus species NOT DETECTED NOT DETECTED Final   Serratia marcescens NOT DETECTED NOT DETECTED Final   Haemophilus influenzae NOT DETECTED NOT DETECTED Final   Neisseria meningitidis NOT DETECTED NOT DETECTED Final   Pseudomonas aeruginosa NOT DETECTED NOT DETECTED Final   Candida albicans NOT DETECTED NOT DETECTED Final   Candida glabrata NOT DETECTED NOT DETECTED Final   Candida krusei NOT DETECTED NOT DETECTED Final   Candida parapsilosis NOT DETECTED NOT DETECTED Final   Candida tropicalis NOT DETECTED NOT DETECTED Final  Blood culture (routine x 2)     Status: Abnormal (Preliminary result)   Collection Time: 12/03/16 10:07 AM  Result Value Ref Range Status   Specimen Description BLOOD RIGHT ARM  Final   Special Requests BOTTLES DRAWN AEROBIC AND ANAEROBIC BCHV  Final   Culture  Setup Time   Final    GRAM POSITIVE COCCI ANAEROBIC BOTTLE ONLY CRITICAL VALUE NOTED.  VALUE IS CONSISTENT WITH PREVIOUSLY REPORTED AND CALLED VALUE. AEROBIC BOTTLE ONLY GRAM POSITIVE RODS CRITICAL RESULT CALLED TO, READ BACK BY AND VERIFIED WITH: GARRETT COFFEE ON 12/04/16 AT [facility]    Culture (A)  Final    STAPHYLOCOCCUS EPIDERMIDIS THE SIGNIFICANCE OF ISOLATING THIS ORGANISM FROM A SINGLE SET OF BLOOD CULTURES WHEN MULTIPLE SETS ARE DRAWN IS UNCERTAIN. PLEASE NOTIFY THE MICROBIOLOGY DEPARTMENT WITHIN ONE WEEK IF SPECIATION AND SENSITIVITIES ARE REQUIRED. DIPHTHEROIDS(CORYNEBACTERIUM SPECIES) Standardized susceptibility testing for this organism is not available. Performed at [facility], [place] [address], [place], [place] [address]    Report Status PENDING  Incomplete  Blood Culture ID Panel (Reflexed)     Status: None   Collection Time: 12/03/16 10:07 AM  Result Value Ref Range Status   Enterococcus species NOT DETECTED NOT DETECTED Final   Listeria monocytogenes  NOT DETECTED NOT DETECTED Final   Staphylococcus species NOT DETECTED NOT DETECTED Final   Staphylococcus aureus NOT DETECTED NOT DETECTED Final   Streptococcus species NOT DETECTED NOT DETECTED Final   Streptococcus agalactiae NOT DETECTED NOT DETECTED Final   Streptococcus pneumoniae NOT DETECTED NOT DETECTED Final   Streptococcus pyogenes NOT DETECTED NOT DETECTED Final   Acinetobacter baumannii NOT DETECTED NOT DETECTED Final   Enterobacteriaceae species NOT DETECTED NOT DETECTED Final   Enterobacter cloacae complex NOT DETECTED NOT DETECTED Final   Escherichia coli NOT DETECTED NOT DETECTED Final   Klebsiella oxytoca NOT DETECTED NOT DETECTED Final   Klebsiella pneumoniae NOT DETECTED NOT DETECTED Final   Proteus species NOT DETECTED NOT DETECTED Final   Serratia marcescens NOT DETECTED NOT DETECTED Final   Haemophilus influenzae NOT DETECTED NOT DETECTED Final   Neisseria meningitidis NOT DETECTED NOT DETECTED Final   Pseudomonas aeruginosa NOT DETECTED NOT DETECTED Final   Candida albicans NOT DETECTED NOT DETECTED Final   Candida glabrata NOT DETECTED NOT DETECTED Final   Candida krusei NOT DETECTED NOT DETECTED Final   Candida parapsilosis NOT DETECTED NOT DETECTED  Final   Candida tropicalis NOT DETECTED NOT DETECTED Final  MRSA PCR Screening     Status: None   Collection Time: 12/03/16  6:12 PM  Result Value Ref Range Status   MRSA by PCR NEGATIVE NEGATIVE Final    Comment:        The GeneXpert MRSA Assay (FDA approved for NASAL specimens only), is one component of a comprehensive MRSA colonization surveillance program. It is not intended to diagnose MRSA infection nor to guide or monitor treatment for MRSA infections.   Culture, sputum-assessment     Status: None   Collection Time: 12/04/16 11:34 PM  Result Value Ref Range Status   Specimen Description SPU  Final   Special Requests NONE  Final   Sputum evaluation THIS SPECIMEN IS ACCEPTABLE FOR SPUTUM CULTURE   Final   Report Status 12/05/2016 FINAL  Final  Culture, respiratory (NON-Expectorated)     Status: None (Preliminary result)   Collection Time: 12/04/16 11:34 PM  Result Value Ref Range Status   Specimen Description SPU  Final   Special Requests NONE Reflexed from [id number]  Final   Gram Stain   Final    FEW WBC PRESENT, PREDOMINANTLY MONONUCLEAR RARE SQUAMOUS EPITHELIAL CELLS PRESENT RARE GRAM POSITIVE COCCI IN PAIRS Performed at [facility], [place] [address], [place], [place] [address]    Culture PENDING  Incomplete   Report Status PENDING  Incomplete    IMAGING: Dg Chest 2 View  Result Date: 12/05/2016 CLINICAL DATA:  Shortness of breath for 2-3 days.  Pulmonary mass. EXAM: CHEST  2 VIEW COMPARISON:  Chest radiograph 12/03/2017 FINDINGS: The cardiomediastinal silhouette is distorted by a known left pulmonary mass. Fullness of bilateral hilar regions. There diffusely increased interstitial markings, right more than left. There has been interval development of probable right pleural effusion. No evidence of pneumothorax. Bilateral apical pleural thickening. Stable spinal fusion across moderate lower thoracic level compression fracture. Lower cervical fusion noted. Soft tissues are grossly normal. IMPRESSION: Interval development of increased interstitial markings, which may be seen with interstitial pulmonary edema or hemorrhage. Interval development of right pleural effusion. Known left pulmonary mass. Electronically Signed   By: Fidela Salisbury M.D.   On: 12/05/2016 07:48   Ct Chest W Contrast  Result Date: 11/23/2016 CLINICAL DATA:  Cough, loss of voice for several months, malaise EXAM: CT CHEST WITH CONTRAST TECHNIQUE: Multidetector CT imaging of the chest was performed during intravenous contrast administration. CONTRAST:  30m ISOVUE-300 IOPAMIDOL (ISOVUE-300) INJECTION 61% COMPARISON:  Chest x-ray of 11/21/2014 FINDINGS: Cardiovascular: The heart is within normal limits in  size. Diffuse coronary artery calcifications are present. Moderate abdominal aortic atherosclerosis is noted. The mid ascending thoracic aorta measures 34 mm in diameter. Mediastinum/Nodes: There is massive mediastinal and left hilar adenopathy present. A subcarinal node measures 29 mm in diameter. A right pretracheal node measures 28 mm in diameter within anterior prevascular node of 29 mm in diameter. Left hilar nodes are difficult to measure accurately due to volume loss. Adenopathy extends in the left paratracheal, left supraclavicular region, and left lower neck as well. Lungs/Pleura: There is a large rounded mass within much of the left lower lobe posterior medially. On image 81 series 2 this mass measures 8.4 x 11.8 cm with left hilar and mediastinal extension. This is consistent with a large left lower lobe carcinoma. Multiple primarily ground-glass nodules are throughout the left lung consistent with ipsilateral metastases. No definite right lung nodule is seen. No pleural effusion is noted.  This mass causes extrinsic compression and narrowing of the left main stem bronchus as well as the bronchus to the left lower lobe and lingula. Upper Abdomen: There is suspicion of a pathologically enlarged left periaortic node on the last image, 169 series 2. No hepatic lesion is seen. Small noncalcified gallstones and/or gallbladder debris layer within the gallbladder. Moderate abdominal aortic atherosclerosis is noted. Musculoskeletal: Hardware for fusion of the lower cervical and lower thoracic - upper lumbar spine is noted no lytic or blastic bony lesion is seen with certainty. IMPRESSION: 1. Large mass occupies much of the left lower lobe with mediastinal and left hilar extension consistent with primary lung carcinoma. 2. Poorly defined ground-glass opacities within the left lung most consistent with ipsilateral lung metastases. 3. Diffuse coronary artery calcifications and changes of abdominal aortic  atherosclerosis. 4. Gallbladder sludge and/or gallstones. Electronically Signed   By: Ivar Drape M.D.   On: 11/23/2016 12:13   Mr Brain Wo Contrast  Result Date: 12/05/2016 CLINICAL DATA:  Small cell lung cancer staging EXAM: MRI HEAD WITHOUT CONTRAST TECHNIQUE: Multiplanar, multiecho pulse sequences of the brain and surrounding structures were obtained without intravenous contrast. COMPARISON:  CT 06/05/2014 FINDINGS: Limited study. The patient was not able to hold still due to extreme discomfort. Limited imaging. No contrast administered Axial diffusion-weighted imaging is diagnostic. No acute infarct. No areas of restricted diffusion. Ventricle size normal.  No mass or fluid collection identified. IMPRESSION: Limited study. The patient was not able to complete the study and minimal imaging was performed. Allowing for this no significant abnormality was identified. Electronically Signed   By: Franchot Gallo M.D.   On: 12/05/2016 13:24   Ct Abdomen Pelvis W Contrast  Result Date: 12/05/2016 CLINICAL DATA:  History of lung cancer. Shortness-of-breath with nonproductive cough and hoarseness. Oxygen dependent. Heavy smoker. EXAM: CT ABDOMEN AND PELVIS WITH CONTRAST TECHNIQUE: Multidetector CT imaging of the abdomen and pelvis was performed using the standard protocol following bolus administration of intravenous contrast. CONTRAST:  152m ISOVUE-300 IOPAMIDOL (ISOVUE-300) INJECTION 61% COMPARISON:  Chest CT 11/23/2016 FINDINGS: Lower chest: No significant change in known large left lower lobe lung mass presumed to be primary bronchogenic carcinoma. Adjacent small nodules within the left lower lobe likely metastatic disease. Patchy bilateral nodular airspace process new since the previous exam as the acuteness suggests infection and much less likely progression of patient's neoplastic disease. Small amount of bilateral pleural fluid right greater than left which is new. Calcified plaque over the left  anterior descending and right coronary arteries. Hepatobiliary: 7 small hypodense liver lesions most not well seen on prior exam as the largest is over the right lobe measuring 1.7 cm. This is likely due to metastatic disease. Minimal sludge versus cholelithiasis. Biliary tree is within normal. Pancreas: Within normal. Spleen: Within normal. Adrenals/Urinary Tract: Adrenal glands are normal. Kidneys normal in size without hydronephrosis or nephrolithiasis. Ureters and bladder are normal. Stomach/Bowel: Stomach and small bowel are unremarkable. Appendix is normal. Moderate fecal retention throughout the colon. Vascular/Lymphatic: Moderate calcified plaque over the abdominal aorta and iliac arteries. No definite adenopathy within the abdomen/pelvis. Reproductive: Within normal. Other: No free fluid or focal inflammatory change. Musculoskeletal: Moderate spondylosis of the spine with curvature of the thoracolumbar spine convex right and multilevel disc disease. Fusion hardware is present throughout the visualized thoracolumbar spine which is intact and unchanged. Multiple stable compression fractures over the visualized thoracolumbar spine. IMPRESSION: Stable partially visualized large left lower lobe mass with several small adjacent nodules likely adjacent  metastatic disease. Approximately 7 small hypodense liver lesions not well seen on the prior exam with the largest 1.7 cm over the right lobe compatible with metastatic disease. New patchy nodular airspace process over the mid to lower lungs right worse than left as the q.d. suggest infection and much less likely progression of neoplastic disease. New small bilateral pleural effusions right greater than left. Aortic Atherosclerosis (ICD10-I70.0). Atherosclerotic coronary artery disease. Minimal sludge versus cholelithiasis. Moderate spondylosis of the spine with multilevel disc disease and multiple stable compression fractures. Posterior fusion hardware intact over  the thoracolumbar spine. Electronically Signed   By: Marin Olp M.D.   On: 12/05/2016 14:24   Dg Chest Portable 1 View  Result Date: 12/03/2016 CLINICAL DATA:  Worsening shortness of breath over the past 6 weeks in patient with a left lower lobe mass. EXAM: PORTABLE CHEST 1 VIEW COMPARISON:  CT chest 11/23/2016. Single-view of the chest with 11/21/2014. FINDINGS: Extensive perihilar airspace disease is present bilaterally. Bulky mediastinal lymphadenopathy is identified in the left lower lobe mass is noted but better visualized on prior CT. Heart size is normal. No pneumothorax or pleural effusion. IMPRESSION: Extensive perihilar airspace disease could be due to pneumonia or edema. Bulky mediastinal lymphadenopathy and left lower lobe mass as seen on prior CT. Electronically Signed   By: Inge Rise M.D.   On: 12/03/2016 09:50    Assessment:   Steven Davis is a 56 y.o. male admitted with shortness of breath and a recently diagnosed large mass in his lung with metastases.  His white count is normal and he has no fever.  He has had IV cefepime and vancomycin.  Blood cultures are positive in 2 sets but with 2 different forms of coag negative staph The bacteremia is likely contaminant and I would not treat this.   Sputum cultures is pending. MRSA PCR negative   Recommendations Can discontinue vancomycin.   Continue cefepime pending culture but will likely change to levofloxacin if culture is unrevealing.  Would treat for a 10 day course.   Thank you very much for allowing me to participate in the care of this patient. Please call with questions.   Cheral Marker. Ola Spurr, MD

## 2016-12-06 NOTE — Care Management Important Message (Signed)
Important Message  Patient Details  Name: ALEKSANDER EDMISTON MRN: [id number] Date of Birth: 07-12-1960   Medicare Important Message Given:  Yes    Shelbie Ammons, RN 12/06/2016, 8:23 AM

## 2016-12-06 NOTE — Procedures (Signed)
US biopsy of left supraclavicular node  Complications:  None  Blood Loss: none  See dictation in canopy pacs

## 2016-12-07 ENCOUNTER — Encounter: Payer: Self-pay | Admitting: *Deleted

## 2016-12-07 LAB — COMPREHENSIVE METABOLIC PANEL
ALT: 20 U/L (ref 17–63)
AST: 31 U/L (ref 15–41)
Albumin: 2.9 g/dL — ABNORMAL LOW (ref 3.5–5.0)
Alkaline Phosphatase: 89 U/L (ref 38–126)
Anion gap: 11 (ref 5–15)
BILIRUBIN TOTAL: 0.7 mg/dL (ref 0.3–1.2)
BUN: 13 mg/dL (ref 6–20)
CHLORIDE: 86 mmol/L — AB (ref 101–111)
CO2: 28 mmol/L (ref 22–32)
CREATININE: 0.68 mg/dL (ref 0.61–1.24)
Calcium: 8.8 mg/dL — ABNORMAL LOW (ref 8.9–10.3)
Glucose, Bld: 130 mg/dL — ABNORMAL HIGH (ref 65–99)
POTASSIUM: 3.4 mmol/L — AB (ref 3.5–5.1)
Sodium: 125 mmol/L — ABNORMAL LOW (ref 135–145)
TOTAL PROTEIN: 6.4 g/dL — AB (ref 6.5–8.1)

## 2016-12-07 LAB — CULTURE, RESPIRATORY W GRAM STAIN: Culture: NORMAL

## 2016-12-07 LAB — CULTURE, BLOOD (ROUTINE X 2)

## 2016-12-07 LAB — CBC
HEMATOCRIT: 39.1 % — AB (ref 40.0–52.0)
Hemoglobin: 13.4 g/dL (ref 13.0–18.0)
MCH: 32.3 pg (ref 26.0–34.0)
MCHC: 34.1 g/dL (ref 32.0–36.0)
MCV: 94.7 fL (ref 80.0–100.0)
PLATELETS: 318 10*3/uL (ref 150–440)
RBC: 4.13 MIL/uL — ABNORMAL LOW (ref 4.40–5.90)
RDW: 15.5 % — AB (ref 11.5–14.5)
WBC: 7.5 10*3/uL (ref 3.8–10.6)

## 2016-12-07 LAB — CULTURE, RESPIRATORY

## 2016-12-07 MED ORDER — NICOTINE 21 MG/24HR TD PT24: 21.0000 mg | MEDICATED_PATCH | Freq: Every day | TRANSDERMAL | 0 refills | Status: AC

## 2016-12-07 MED ORDER — LEVOFLOXACIN 250 MG PO TABS: 250.0000 mg | ORAL_TABLET | Freq: Every day | ORAL | 0 refills | Status: AC

## 2016-12-07 MED ORDER — PREDNISONE 10 MG (21) PO TBPK: ORAL_TABLET | ORAL | 0 refills | Status: DC

## 2016-12-07 MED ORDER — OXYCODONE HCL 5 MG PO TABS: 5.0000 mg | ORAL_TABLET | Freq: Four times a day (QID) | ORAL | 0 refills | Status: DC | PRN

## 2016-12-07 MED ORDER — OXYCODONE HCL ER 10 MG PO T12A: 10.0000 mg | EXTENDED_RELEASE_TABLET | Freq: Two times a day (BID) | ORAL | 0 refills | Status: DC

## 2016-12-07 MED ORDER — DOCUSATE SODIUM 100 MG PO CAPS: 100.0000 mg | ORAL_CAPSULE | Freq: Two times a day (BID) | ORAL | 0 refills | Status: AC | PRN

## 2016-12-07 MED ORDER — ENSURE ENLIVE PO LIQD: 237.0000 mL | Freq: Two times a day (BID) | ORAL | 12 refills | Status: AC

## 2016-12-07 MED ORDER — FLUTICASONE-SALMETEROL 250-50 MCG/DOSE IN AEPB: 1.0000 | INHALATION_SPRAY | Freq: Two times a day (BID) | RESPIRATORY_TRACT | 1 refills | Status: AC

## 2016-12-07 NOTE — Care Management (Signed)
Discharge to home today per Dr. Anselm Jungling. Qualified for home oxygen. Steven Davis. Will update representative. Palliative Care in the home. Decided to receive follow-up services at the local Cancer center Family will transport Shelbie Ammons RN MSN [place] [facility] [phone]

## 2016-12-07 NOTE — Discharge Summary (Signed)
[facility] NAME: Steven Davis    MR#:  [id number]  DATE OF BIRTH:  April 11, 1960  DATE OF ADMISSION:  12/03/2016 ADMITTING PHYSICIAN: Nicholes Mango, MD  DATE OF DISCHARGE: 12/07/2016  PRIMARY CARE PHYSICIAN: [place], [facility]    ADMISSION DIAGNOSIS:  Hypoxia [R09.02] HCAP (healthcare-associated pneumonia) [J18.9] Dyspnea, unspecified type [R06.00] Congestive heart failure, unspecified HF chronicity, unspecified heart failure type ([place]) [I50.9]  DISCHARGE DIAGNOSIS:  Active Problems:   HCAP (healthcare-associated pneumonia)   Dyspnea   Lung mass   Hypoxia   SECONDARY DIAGNOSIS:   Past Medical History:  Diagnosis Date  . Anemia   . Erectile dysfunction   . GERD (gastroesophageal reflux disease)   . Hypertension     HOSPITAL COURSE:   #Acute hypoxemic respiratory failure secondary to healthcare associated pneumonia and underlying bronchogenic carcinoma Mid to MedSurg unit Broad spectrum IV antibiotics with cefepime and vancomycin Bronchodilator treatments Sputum culture and sensitivity Wean off oxygen as tolerated  need oxygen on discharge.   Appreciated help by ID-  Change to oral levaquin on d/c.   Will give tapering steroids on d/c.  # bacteria in one blood culture.   Looks contamination.   Follow results.   Already on vanc.   ID consult appreciated, change to levaquin on d.c.  #Essential hypertension Continue home medication Norvasc and lisinopril and titrate as needed  # new findings of left lung mass-bronchogenic carcinoma Called Inpatient consult to have further diagnostics.  Ordered CT abd, MRI brain for mets and  radiology to do Biopsy of Supraclavicular nodes.   Also noted to have some lesions in liver.   Radiation oncology to see the pt also as the mass seems compressing on left bronchus.   US guided LN biopsy done.   He is Improving, so can follow in cancer center in 1  week.  # GERD PPI  #Tobacco abuse disorder Counseled patient to quit smoking for 5 minutes. He is agreeable with nicotine patch   DISCHARGE CONDITIONS:   Stable.  CONSULTS OBTAINED:  Treatment Team:  Earlie Server, MD Noreene Filbert, MD Leonel Ramsay, MD  DRUG ALLERGIES:   Allergies  Allergen Reactions  . Benadryl [Diphenhydramine Hcl (Sleep)] Nausea And Vomiting    DISCHARGE MEDICATIONS:   Current Discharge Medication List    START taking these medications   Details  docusate sodium (COLACE) 100 MG capsule Take 1 capsule (100 mg total) 2 (two) times daily as needed by mouth for mild constipation. Qty: 10 capsule, Refills: 0    feeding supplement, ENSURE ENLIVE, (ENSURE ENLIVE) LIQD Take 237 mLs 2 (two) times daily between meals by mouth. Qty: 237 mL, Refills: 12    Fluticasone-Salmeterol (ADVAIR DISKUS) 250-50 MCG/DOSE AEPB Inhale 1 puff 2 (two) times daily into the lungs. Qty: 60 each, Refills: 1    levofloxacin (LEVAQUIN) 250 MG tablet Take 1 tablet (250 mg total) daily for 7 days by mouth. Qty: 7 tablet, Refills: 0    nicotine (NICODERM CQ - DOSED IN MG/24 HOURS) 21 mg/24hr patch Place 1 patch (21 mg total) daily onto the skin. Qty: 28 patch, Refills: 0    oxyCODONE (OXYCONTIN) 10 mg 12 hr tablet Take 1 tablet (10 mg total) 2 (two) times daily by mouth. Qty: 20 tablet, Refills: 0    predniSONE (STERAPRED UNI-PAK 21 TAB) 10 MG (21) TBPK tablet Take 6 tabs first day, 5 tab on day 2, then 4 on day 3rd, 3  tabs on day 4th , 2 tab on day 5th, and 1 tab on 6th day. Qty: 21 tablet, Refills: 0      CONTINUE these medications which have CHANGED   Details  oxyCODONE (OXY IR/ROXICODONE) 5 MG immediate release tablet Take 1 tablet (5 mg total) every 6 (six) hours as needed by mouth for severe pain. Qty: 30 tablet, Refills: 0      CONTINUE these medications which have NOT CHANGED   Details  amLODipine (NORVASC) 10 MG tablet Take 10 mg by mouth daily.     ferrous sulfate 325 (65 FE) MG tablet Take 325 mg daily by mouth.    lisinopril (PRINIVIL,ZESTRIL) 40 MG tablet Take 40 mg by mouth daily.    omeprazole (PRILOSEC) 40 MG capsule Take 40 mg by mouth daily.    albuterol (PROVENTIL HFA;VENTOLIN HFA) 108 (90 BASE) MCG/ACT inhaler Inhale 2 puffs into the lungs every 6 (six) hours as needed for wheezing or shortness of breath.    tadalafil (CIALIS) 10 MG tablet Take 10 mg by mouth daily as needed for erectile dysfunction.         DISCHARGE INSTRUCTIONS:    To be followed by palliative care nurse at home. Follow at cancer center in 1 week.  If you experience worsening of your admission symptoms, develop shortness of breath, life threatening emergency, suicidal or homicidal thoughts you must seek medical attention immediately by calling 911 or calling your MD immediately  if symptoms less severe.  You Must read complete instructions/literature along with all the possible adverse reactions/side effects for all the Medicines you take and that have been prescribed to you. Take any new Medicines after you have completely understood and accept all the possible adverse reactions/side effects.   Please note  You were cared for by a hospitalist during your hospital stay. If you have any questions about your discharge medications or the care you received while you were in the hospital after you are discharged, you can call the unit and asked to speak with the hospitalist on call if the hospitalist that took care of you is not available. Once you are discharged, your primary care physician will handle any further medical issues. Please note that NO REFILLS for any discharge medications will be authorized once you are discharged, as it is imperative that you return to your primary care physician (or establish a relationship with a primary care physician if you do not have one) for your aftercare needs so that they can reassess your need for medications and  monitor your lab values.    Today   CHIEF COMPLAINT:   Chief Complaint  Patient presents with  . Shortness of Breath    HISTORY OF PRESENT ILLNESS:  Steven Davis  is a 56 y.o. male with a known history of hypertension,  GERD, COPD, recent diagnosis of left lung mass seen by [facility] oncology highly suspicious for bronchogenic carcinoma, is presenting to the ED with a chief complaint of shortness of breath.  Patient does not use any oxygen at home.  He was desaturated to 73% on room air today and he was placed on 5-6 L of oxygen in the emergency department.  Chest x-ray with bulky perihilar airspace disease due to pneumonia or edema.  Patient has used Augmentin 3 weeks ago for possible pneumonia.  Patient is started on Zosyn and vancomycin in the emergency department and hospitalist team was called to admit the patient   VITAL SIGNS:  Blood pressure (!) 141/87,  pulse 97, temperature 98 F (36.7 C), temperature source Oral, resp. rate 20, height 5\' 3"  (1.6 m), weight 59.1 kg (130 lb 6 oz), SpO2 95 %.  I/O:    Intake/Output Summary (Last 24 hours) at 12/07/2016 0905 Last data filed at 12/07/2016 0510 Gross per 24 hour  Intake 240 ml  Output 900 ml  Net -660 ml    PHYSICAL EXAMINATION:   GENERAL:  56 y.o.-year-old patient lying in the bed with no acute distress.  EYES: Pupils equal, round, reactive to light and accommodation. No scleral icterus. Extraocular muscles intact.  HEENT: Head atraumatic, normocephalic. Oropharynx and nasopharynx clear.  NECK:  Supple, no jugular venous distention. No thyroid enlargement, no tenderness.  LUNGS: Normal breath sounds bilaterally, no wheezing, b/l crepitation. Positive for use of accessory muscles of respiration. On 4 ltr oxygen. CARDIOVASCULAR: S1, S2 normal. No murmurs, rubs, or gallops.  ABDOMEN: Soft, nontender, nondistended. Bowel sounds present. No organomegaly or mass.    Surgical marks on back. EXTREMITIES: No pedal edema, cyanosis,  or clubbing.  NEUROLOGIC: Cranial nerves II through XII are intact. Muscle strength 5/5 in all extremities. Sensation intact. Gait not checked.  PSYCHIATRIC: The patient is alert and oriented x 3. Anxious and in pain. SKIN: No obvious rash, lesion, or ulcer.     DATA REVIEW:   CBC Recent Labs  Lab 12/07/16 0415  WBC 7.5  HGB 13.4  HCT 39.1*  PLT 318    Chemistries  Recent Labs  Lab 12/04/16 0548  12/07/16 0415  NA  --    < > 125*  K  --    < > 3.4*  CL  --    < > 86*  CO2  --    < > 28  GLUCOSE  --    < > 130*  BUN  --    < > 13  CREATININE 0.39*   < > 0.68  CALCIUM  --    < > 8.8*  MG 1.6*  --   --   AST  --   --  31  ALT  --   --  20  ALKPHOS  --   --  89  BILITOT  --   --  0.7   < > = values in this interval not displayed.    Cardiac Enzymes Recent Labs  Lab 12/03/16 0933  TROPONINI 0.04*    Microbiology Results  Results for orders placed or performed during the hospital encounter of 12/03/16  Blood culture (routine x 2)     Status: Abnormal   Collection Time: 12/03/16 10:00 AM  Result Value Ref Range Status   Specimen Description BLOOD RIGHT ARM  Final   Special Requests BOTTLES DRAWN AEROBIC AND ANAEROBIC [place]  Final   Culture  Setup Time   Final    AEROBIC BOTTLE ONLY GRAM POSITIVE COCCI CRITICAL RESULT CALLED TO, READ BACK BY AND VERIFIED WITH: CHRISTINE KATSOUDAS ON 12/04/16 AT 0730 QSD    Culture (A)  Final    STAPHYLOCOCCUS HOMINIS THE SIGNIFICANCE OF ISOLATING THIS ORGANISM FROM A SINGLE SET OF BLOOD CULTURES WHEN MULTIPLE SETS ARE DRAWN IS UNCERTAIN. PLEASE NOTIFY THE MICROBIOLOGY DEPARTMENT WITHIN ONE WEEK IF SPECIATION AND SENSITIVITIES ARE REQUIRED. Performed at [facility], [place] [address] [place], [place] [address]    Report Status 12/06/2016 FINAL  Final  Blood Culture ID Panel (Reflexed)     Status: Abnormal   Collection Time: 12/03/16 10:00 AM  Result Value Ref Range  Status   Enterococcus species NOT DETECTED NOT DETECTED  Final   Listeria monocytogenes NOT DETECTED NOT DETECTED Final   Staphylococcus species DETECTED (A) NOT DETECTED Final    Comment: Methicillin (oxacillin) susceptible coagulase negative staphylococcus. Possible blood culture contaminant (unless isolated from more than one blood culture draw or clinical case suggests pathogenicity). No antibiotic treatment is indicated for blood  culture contaminants. CRITICAL RESULT CALLED TO, READ BACK BY AND VERIFIED WITH: CHRISTINE KATSOUDAS ON 12/04/16 AT 0730 QSD    Staphylococcus aureus NOT DETECTED NOT DETECTED Final   Methicillin resistance NOT DETECTED NOT DETECTED Final   Streptococcus species NOT DETECTED NOT DETECTED Final   Streptococcus agalactiae NOT DETECTED NOT DETECTED Final   Streptococcus pneumoniae NOT DETECTED NOT DETECTED Final   Streptococcus pyogenes NOT DETECTED NOT DETECTED Final   Acinetobacter baumannii NOT DETECTED NOT DETECTED Final   Enterobacteriaceae species NOT DETECTED NOT DETECTED Final   Enterobacter cloacae complex NOT DETECTED NOT DETECTED Final   Escherichia coli NOT DETECTED NOT DETECTED Final   Klebsiella oxytoca NOT DETECTED NOT DETECTED Final   Klebsiella pneumoniae NOT DETECTED NOT DETECTED Final   Proteus species NOT DETECTED NOT DETECTED Final   Serratia marcescens NOT DETECTED NOT DETECTED Final   Haemophilus influenzae NOT DETECTED NOT DETECTED Final   Neisseria meningitidis NOT DETECTED NOT DETECTED Final   Pseudomonas aeruginosa NOT DETECTED NOT DETECTED Final   Candida albicans NOT DETECTED NOT DETECTED Final   Candida glabrata NOT DETECTED NOT DETECTED Final   Candida krusei NOT DETECTED NOT DETECTED Final   Candida parapsilosis NOT DETECTED NOT DETECTED Final   Candida tropicalis NOT DETECTED NOT DETECTED Final  Blood culture (routine x 2)     Status: Abnormal (Preliminary result)   Collection Time: 12/03/16 10:07 AM  Result Value Ref Range Status   Specimen Description BLOOD RIGHT ARM  Final    Special Requests BOTTLES DRAWN AEROBIC AND ANAEROBIC [facility]  Final   Culture  Setup Time   Final    GRAM POSITIVE COCCI ANAEROBIC BOTTLE ONLY CRITICAL VALUE NOTED.  VALUE IS CONSISTENT WITH PREVIOUSLY REPORTED AND CALLED VALUE. AEROBIC BOTTLE ONLY GRAM POSITIVE RODS CRITICAL RESULT CALLED TO, READ BACK BY AND VERIFIED WITH: GARRETT COFFEE ON 12/04/16 AT [facility]    Culture (A)  Final    STAPHYLOCOCCUS EPIDERMIDIS THE SIGNIFICANCE OF ISOLATING THIS ORGANISM FROM A SINGLE SET OF BLOOD CULTURES WHEN MULTIPLE SETS ARE DRAWN IS UNCERTAIN. PLEASE NOTIFY THE MICROBIOLOGY DEPARTMENT WITHIN ONE WEEK IF SPECIATION AND SENSITIVITIES ARE REQUIRED. DIPHTHEROIDS(CORYNEBACTERIUM SPECIES) Standardized susceptibility testing for this organism is not available. Performed at [facility], [place] [address], [place], [place] [address]    Report Status PENDING  Incomplete  Blood Culture ID Panel (Reflexed)     Status: None   Collection Time: 12/03/16 10:07 AM  Result Value Ref Range Status   Enterococcus species NOT DETECTED NOT DETECTED Final   Listeria monocytogenes NOT DETECTED NOT DETECTED Final   Staphylococcus species NOT DETECTED NOT DETECTED Final   Staphylococcus aureus NOT DETECTED NOT DETECTED Final   Streptococcus species NOT DETECTED NOT DETECTED Final   Streptococcus agalactiae NOT DETECTED NOT DETECTED Final   Streptococcus pneumoniae NOT DETECTED NOT DETECTED Final   Streptococcus pyogenes NOT DETECTED NOT DETECTED Final   Acinetobacter baumannii NOT DETECTED NOT DETECTED Final   Enterobacteriaceae species NOT DETECTED NOT DETECTED Final   Enterobacter cloacae complex NOT DETECTED NOT DETECTED Final   Escherichia coli NOT DETECTED NOT DETECTED Final  Klebsiella oxytoca NOT DETECTED NOT DETECTED Final   Klebsiella pneumoniae NOT DETECTED NOT DETECTED Final   Proteus species NOT DETECTED NOT DETECTED Final   Serratia marcescens NOT DETECTED NOT DETECTED Final   Haemophilus influenzae  NOT DETECTED NOT DETECTED Final   Neisseria meningitidis NOT DETECTED NOT DETECTED Final   Pseudomonas aeruginosa NOT DETECTED NOT DETECTED Final   Candida albicans NOT DETECTED NOT DETECTED Final   Candida glabrata NOT DETECTED NOT DETECTED Final   Candida krusei NOT DETECTED NOT DETECTED Final   Candida parapsilosis NOT DETECTED NOT DETECTED Final   Candida tropicalis NOT DETECTED NOT DETECTED Final  MRSA PCR Screening     Status: None   Collection Time: 12/03/16  6:12 PM  Result Value Ref Range Status   MRSA by PCR NEGATIVE NEGATIVE Final    Comment:        The GeneXpert MRSA Assay (FDA approved for NASAL specimens only), is one component of a comprehensive MRSA colonization surveillance program. It is not intended to diagnose MRSA infection nor to guide or monitor treatment for MRSA infections.   Culture, sputum-assessment     Status: None   Collection Time: 12/04/16 11:34 PM  Result Value Ref Range Status   Specimen Description SPU  Final   Special Requests NONE  Final   Sputum evaluation THIS SPECIMEN IS ACCEPTABLE FOR SPUTUM CULTURE  Final   Report Status 12/05/2016 FINAL  Final  Culture, respiratory (NON-Expectorated)     Status: None (Preliminary result)   Collection Time: 12/04/16 11:34 PM  Result Value Ref Range Status   Specimen Description SPU  Final   Special Requests NONE Reflexed from [id number]  Final   Gram Stain   Final    FEW WBC PRESENT, PREDOMINANTLY MONONUCLEAR RARE SQUAMOUS EPITHELIAL CELLS PRESENT RARE GRAM POSITIVE COCCI IN PAIRS    Culture   Final    CULTURE REINCUBATED FOR BETTER GROWTH Performed at [facility], [place] [address] [place], [place] [address]    Report Status PENDING  Incomplete    RADIOLOGY:  Mr Brain Wo Contrast  Result Date: 12/05/2016 CLINICAL DATA:  Small cell lung cancer staging EXAM: MRI HEAD WITHOUT CONTRAST TECHNIQUE: Multiplanar, multiecho pulse sequences of the brain and surrounding structures were obtained  without intravenous contrast. COMPARISON:  CT 06/05/2014 FINDINGS: Limited study. The patient was not able to hold still due to extreme discomfort. Limited imaging. No contrast administered Axial diffusion-weighted imaging is diagnostic. No acute infarct. No areas of restricted diffusion. Ventricle size normal.  No mass or fluid collection identified. IMPRESSION: Limited study. The patient was not able to complete the study and minimal imaging was performed. Allowing for this no significant abnormality was identified. Electronically Signed   By: Franchot Gallo M.D.   On: 12/05/2016 13:24   Ct Abdomen Pelvis W Contrast  Result Date: 12/05/2016 CLINICAL DATA:  History of lung cancer. Shortness-of-breath with nonproductive cough and hoarseness. Oxygen dependent. Heavy smoker. EXAM: CT ABDOMEN AND PELVIS WITH CONTRAST TECHNIQUE: Multidetector CT imaging of the abdomen and pelvis was performed using the standard protocol following bolus administration of intravenous contrast. CONTRAST:  139mL ISOVUE-300 IOPAMIDOL (ISOVUE-300) INJECTION 61% COMPARISON:  Chest CT 11/23/2016 FINDINGS: Lower chest: No significant change in known large left lower lobe lung mass presumed to be primary bronchogenic carcinoma. Adjacent small nodules within the left lower lobe likely metastatic disease. Patchy bilateral nodular airspace process new since the previous exam as the acuteness suggests infection and much less likely progression of patient's  neoplastic disease. Small amount of bilateral pleural fluid right greater than left which is new. Calcified plaque over the left anterior descending and right coronary arteries. Hepatobiliary: 7 small hypodense liver lesions most not well seen on prior exam as the largest is over the right lobe measuring 1.7 cm. This is likely due to metastatic disease. Minimal sludge versus cholelithiasis. Biliary tree is within normal. Pancreas: Within normal. Spleen: Within normal. Adrenals/Urinary Tract:  Adrenal glands are normal. Kidneys normal in size without hydronephrosis or nephrolithiasis. Ureters and bladder are normal. Stomach/Bowel: Stomach and small bowel are unremarkable. Appendix is normal. Moderate fecal retention throughout the colon. Vascular/Lymphatic: Moderate calcified plaque over the abdominal aorta and iliac arteries. No definite adenopathy within the abdomen/pelvis. Reproductive: Within normal. Other: No free fluid or focal inflammatory change. Musculoskeletal: Moderate spondylosis of the spine with curvature of the thoracolumbar spine convex right and multilevel disc disease. Fusion hardware is present throughout the visualized thoracolumbar spine which is intact and unchanged. Multiple stable compression fractures over the visualized thoracolumbar spine. IMPRESSION: Stable partially visualized large left lower lobe mass with several small adjacent nodules likely adjacent metastatic disease. Approximately 7 small hypodense liver lesions not well seen on the prior exam with the largest 1.7 cm over the right lobe compatible with metastatic disease. New patchy nodular airspace process over the mid to lower lungs right worse than left as the q.d. suggest infection and much less likely progression of neoplastic disease. New small bilateral pleural effusions right greater than left. Aortic Atherosclerosis (ICD10-I70.0). Atherosclerotic coronary artery disease. Minimal sludge versus cholelithiasis. Moderate spondylosis of the spine with multilevel disc disease and multiple stable compression fractures. Posterior fusion hardware intact over the thoracolumbar spine. Electronically Signed   By: Marin Olp M.D.   On: 12/05/2016 14:24   [place] Core Biopsy (lymph Nodes)  Result Date: 12/06/2016 INDICATION: Known left lower lobe lung mass with supraclavicular lymphadenopathy. EXAM: ULTRASOUND-GUIDED LEFT SUPRACLAVICULAR LYMPH NODE BIOPSY. MEDICATIONS: None. ANESTHESIA/SEDATION: None FLUOROSCOPY TIME:   Not applicable COMPLICATIONS: None immediate. PROCEDURE: Informed written consent was obtained from the patient after a thorough discussion of the procedural risks, benefits and alternatives. All questions were addressed. Maximal Sterile Barrier Technique was utilized including caps, mask, sterile gowns, sterile gloves, sterile drape, hand hygiene and skin antiseptic. A timeout was performed prior to the initiation of the procedure. Utilizing real-time ultrasound guidance and 1% xylocaine as local anesthetic a 17 gauge guiding needle was placed percutaneously into 1 of the left supraclavicular lymph nodes. Multiple 18 gauge core biopsies were then obtained and sent for pathologic evaluation. Dedicated ultrasound imaging was obtained during the needle passes. Guiding needle was then removed and hemostasis obtained at the puncture site. Patient tolerated the procedure well was returned his room in satisfactory condition. IMPRESSION: Successful ultrasound-guided left supraclavicular lymph node biopsy Electronically Signed   By: Inez Catalina M.D.   On: 12/06/2016 15:47    EKG:   Orders placed or performed during the hospital encounter of 10/29/16  . EKG 12-Lead  . EKG 12-Lead  . ED EKG  . ED EKG  . EKG    Management plans discussed with the patient, family and they are in agreement.  CODE STATUS:     Code Status Orders  (From admission, onward)        Start     Ordered   12/03/16 1341  Full code  Continuous     12/03/16 1340    Code Status History    Date Active Date Inactive  Code Status Order ID Comments User Context   11/21/2014 16:16 11/23/2014 14:26 Full Code [id number]  Max Sane, MD ED   06/06/2014 08:09 06/07/2014 13:27 Full Code [id number]  Juluis Mire, MD Inpatient      TOTAL TIME TAKING CARE OF THIS PATIENT: 35 minutes.    Vaughan Basta M.D on 12/07/2016 at 9:05 AM  Between 7am to 6pm - Pager - [phone]  After 6pm go to [email or web address] - password [email or web address]  [facility]  Office  [phone]  CC: Primary care physician; [facility]   Note: This dictation was prepared with Dragon dictation along with smaller phrase technology. Any transcriptional errors that result from this process are unintentional.

## 2016-12-07 NOTE — Progress Notes (Signed)
New referral for Out Patient Palliative to follow received from [facility]. Plan is for discharge home today. Thank you. Flo Shanks RN, BSN, [facility] [place], hospital liaison [phone] c

## 2016-12-07 NOTE — Discharge Instructions (Signed)
Palliative care nurse to follow at home. Follow with cancer center.

## 2016-12-07 NOTE — Progress Notes (Signed)
Pt d/ced home.  Has qualified for home O2.  Appts set up with [facility] for this Friday.  Pt states that he's breathing so much better than when he was admitted.  He's been up to the chair this morning and has ambulated to the nurse's station.  Takes pain medicine for chronic back pain.  No pain r/t to lung ca.  Pt does get SOB w/exertion and has congested sounding cough; also hoarse.  IV removed and D/c instructions reviewed.  Will transport home with his brother.  Daughter will be with him tonight.  Patient is very upbeat given diagnosis.

## 2016-12-07 NOTE — Plan of Care (Signed)
SATURATION QUALIFICATIONS: (This note is used to comply with regulatory documentation for home oxygen)  Patient Saturations on Room Air at Rest = 91%  Patient Saturations on Room Air while Ambulating = 87%  Patient Saturations on 3.5Liters of oxygen while Ambulating = 90%  Please briefly explain why patient needs home oxygen: Pt recently diag w/lung ca with mets to liver.  Mass on L compressing bronchus making it difficult to breath.

## 2016-12-07 NOTE — Progress Notes (Signed)
  Oncology Nurse Navigator Documentation  Navigator Location: [facility] (12/07/16 1100)   )Navigator Encounter Type: Other (12/07/16 1100)   Abnormal Finding Date: 11/23/16 (12/07/16 1100)                 Patient Visit Type: Inpatient (12/07/16 1100) Treatment Phase: Abnormal Scans (12/07/16 1100) Barriers/Navigation Needs: Coordination of Care (12/07/16 1100)   Interventions: Coordination of Care (12/07/16 1100)   Coordination of Care: Appts (12/07/16 1100)        Acuity: Level 2 (12/07/16 1100)   Acuity Level 2: Initial guidance, education and coordination as needed;Educational needs;Assistance expediting appointments (12/07/16 1100)  met patient during hospital admission prior to discharge to review upcoming appts and introduce to navigator services. appt given to pt to follow up with Dr. Tasia Catchings on Friday 11/16 at 3:15pm at the [facility] clinic. Contact info given and instructed pt to call with any questions or needs. Pt verbalized understanding.    Time Spent with Patient: 30 (12/07/16 1100)

## 2016-12-08 ENCOUNTER — Other Ambulatory Visit: Payer: Self-pay | Admitting: *Deleted

## 2016-12-08 ENCOUNTER — Other Ambulatory Visit: Payer: Self-pay | Admitting: Pathology

## 2016-12-08 DIAGNOSIS — C3490 Malignant neoplasm of unspecified part of unspecified bronchus or lung: Secondary | ICD-10-CM

## 2016-12-08 LAB — SURGICAL PATHOLOGY

## 2016-12-10 ENCOUNTER — Ambulatory Visit: Payer: Medicare Other | Admitting: Oncology

## 2016-12-10 ENCOUNTER — Inpatient Hospital Stay: Payer: Medicare Other | Attending: Oncology | Admitting: Oncology

## 2016-12-10 ENCOUNTER — Other Ambulatory Visit: Payer: Self-pay

## 2016-12-10 ENCOUNTER — Ambulatory Visit
Admission: RE | Admit: 2016-12-10 | Discharge: 2016-12-10 | Disposition: A | Discharge status: Home / Self Care | Payer: Medicare Other | Source: Ambulatory Visit | Attending: Radiation Oncology | Admitting: Radiation Oncology

## 2016-12-10 ENCOUNTER — Encounter: Payer: Self-pay | Admitting: *Deleted

## 2016-12-10 ENCOUNTER — Encounter: Payer: Self-pay | Admitting: Oncology

## 2016-12-10 VITALS — BP 145/82 | HR 94 | Temp 98.3°F | Resp 18 | Wt 129.1 lb

## 2016-12-10 DIAGNOSIS — Z7189 Other specified counseling: Secondary | ICD-10-CM

## 2016-12-10 DIAGNOSIS — C770 Secondary and unspecified malignant neoplasm of lymph nodes of head, face and neck: Secondary | ICD-10-CM | POA: Insufficient documentation

## 2016-12-10 DIAGNOSIS — R918 Other nonspecific abnormal finding of lung field: Secondary | ICD-10-CM | POA: Insufficient documentation

## 2016-12-10 DIAGNOSIS — I700 Atherosclerosis of aorta: Secondary | ICD-10-CM | POA: Diagnosis not present

## 2016-12-10 DIAGNOSIS — K219 Gastro-esophageal reflux disease without esophagitis: Secondary | ICD-10-CM | POA: Insufficient documentation

## 2016-12-10 DIAGNOSIS — K769 Liver disease, unspecified: Secondary | ICD-10-CM | POA: Insufficient documentation

## 2016-12-10 DIAGNOSIS — Z79899 Other long term (current) drug therapy: Secondary | ICD-10-CM | POA: Insufficient documentation

## 2016-12-10 DIAGNOSIS — C3490 Malignant neoplasm of unspecified part of unspecified bronchus or lung: Secondary | ICD-10-CM | POA: Insufficient documentation

## 2016-12-10 DIAGNOSIS — E222 Syndrome of inappropriate secretion of antidiuretic hormone: Secondary | ICD-10-CM | POA: Insufficient documentation

## 2016-12-10 DIAGNOSIS — Z5111 Encounter for antineoplastic chemotherapy: Secondary | ICD-10-CM | POA: Diagnosis present

## 2016-12-10 DIAGNOSIS — I10 Essential (primary) hypertension: Secondary | ICD-10-CM | POA: Insufficient documentation

## 2016-12-10 DIAGNOSIS — Z9981 Dependence on supplemental oxygen: Secondary | ICD-10-CM | POA: Insufficient documentation

## 2016-12-10 DIAGNOSIS — F17210 Nicotine dependence, cigarettes, uncomplicated: Secondary | ICD-10-CM | POA: Diagnosis not present

## 2016-12-10 DIAGNOSIS — C7951 Secondary malignant neoplasm of bone: Secondary | ICD-10-CM | POA: Insufficient documentation

## 2016-12-10 DIAGNOSIS — C3432 Malignant neoplasm of lower lobe, left bronchus or lung: Secondary | ICD-10-CM | POA: Diagnosis not present

## 2016-12-10 DIAGNOSIS — Z716 Tobacco abuse counseling: Secondary | ICD-10-CM

## 2016-12-10 LAB — GLUCOSE, CAPILLARY: Glucose-Capillary: 105 mg/dL — ABNORMAL HIGH (ref 65–99)

## 2016-12-10 MED ORDER — FLUDEOXYGLUCOSE F - 18 (FDG) INJECTION
12.1000 | Freq: Once | INTRAVENOUS | Status: AC | PRN
  Administered 2016-12-10: 12.1 via INTRAVENOUS

## 2016-12-10 NOTE — Progress Notes (Addendum)
Hematology/Oncology Follow Up Note [facility]  Telephone:([phone] [phone]  Patient Care Team: [facility] as PCP - General Telford Nab, RN as Registered Nurse   Name of the patient: Steven Davis  [id number]  July 22, 1960   REASON FOR VISIT Following up for management of small cell lung cancer.  HISTORY OF PRESENT ILLNESS Patient was seen and examined the bedside. He reports improvement of his breathing. He had a good night's sleep. Continue to have intermittent cough. He had CT and brain MRI done. He couldn't tolerate brain MRI.CT abdomen and pelvis CT abdomen and pelvis showed multiple hypodense liver lesions. He is also scheduled to have ultrasound-guided biopsy of his left supraclavicular lymph node.He tells me that he usually lies on his right side because of chronic back problem. He feels more comfortable breathing today  INTERVAL HISTORY Patient presents to follow up and discussion of his lung mass biopsy. He uses home oxygen. Still smoking. Reports SOB feels better.   Review of Systems  Constitutional: Positive for malaise/fatigue. Negative for chills and fever.  HENT: Negative for hearing loss.   Eyes: Negative for blurred vision.  Respiratory: Positive for cough, sputum production and shortness of breath.   Cardiovascular: Negative for chest pain.  Gastrointestinal: Negative for heartburn.  Genitourinary: Negative for dysuria.  Musculoskeletal: Negative for myalgias.  Skin: Negative for rash.  Neurological: Negative for dizziness.  Endo/Heme/Allergies: Does not bruise/bleed easily.  Psychiatric/Behavioral: Negative for depression.     Allergies  Allergen Reactions  . Aspirin Other (See Comments)    History of GI bleed  . Benadryl [Diphenhydramine Hcl (Sleep)] Nausea And Vomiting     Past Medical History:  Diagnosis Date  . Anemia   . Erectile dysfunction   . GERD (gastroesophageal reflux disease)   .  Hypertension      Past Surgical History:  Procedure Laterality Date  . BACK SURGERY    . ESOPHAGOGASTRODUODENOSCOPY (EGD) WITH PROPOFOL Left 11/22/2014   Performed by Manya Silvas, MD at [facility] History   Socioeconomic History  . Marital status: Single    Spouse name: Not on file  . Number of children: Not on file  . Years of education: Not on file  . Highest education level: Not on file  Social Needs  . Financial resource strain: Not on file  . Food insecurity - worry: Not on file  . Food insecurity - inability: Not on file  . Transportation needs - medical: Not on file  . Transportation needs - non-medical: Not on file  Occupational History  . Not on file  Tobacco Use  . Smoking status: Current Every Day Smoker    Packs/day: 1.00    Types: Cigarettes  . Smokeless tobacco: Never Used  Substance and Sexual Activity  . Alcohol use: Yes    Alcohol/week: 1.2 - 1.8 oz    Types: 2 - 3 Cans of beer per week  . Drug use: No  . Sexual activity: Not on file  Other Topics Concern  . Not on file  Social History Narrative  . Not on file    Family History  Problem Relation Age of Onset  . Hypertension Father      Current Outpatient Medications:  .  albuterol (PROVENTIL HFA;VENTOLIN HFA) 108 (90 BASE) MCG/ACT inhaler, Inhale 2 puffs into the lungs every 6 (six) hours as needed for wheezing or shortness of breath., Disp: , Rfl:  .  amLODipine (NORVASC)  10 MG tablet, Take 10 mg by mouth daily., Disp: , Rfl:  .  docusate sodium (COLACE) 100 MG capsule, Take 1 capsule (100 mg total) 2 (two) times daily as needed by mouth for mild constipation., Disp: 10 capsule, Rfl: 0 .  feeding supplement, ENSURE ENLIVE, (ENSURE ENLIVE) LIQD, Take 237 mLs 2 (two) times daily between meals by mouth., Disp: 237 mL, Rfl: 12 .  ferrous sulfate 325 (65 FE) MG tablet, Take 325 mg daily by mouth., Disp: , Rfl:  .  Fluticasone-Salmeterol (ADVAIR DISKUS) 250-50 MCG/DOSE AEPB,  Inhale 1 puff 2 (two) times daily into the lungs., Disp: 60 each, Rfl: 1 .  levofloxacin (LEVAQUIN) 250 MG tablet, Take 1 tablet (250 mg total) daily for 7 days by mouth., Disp: 7 tablet, Rfl: 0 .  lisinopril (PRINIVIL,ZESTRIL) 40 MG tablet, Take 40 mg by mouth daily., Disp: , Rfl:  .  nicotine (NICODERM CQ - DOSED IN MG/24 HOURS) 21 mg/24hr patch, Place 1 patch (21 mg total) daily onto the skin., Disp: 28 patch, Rfl: 0 .  omeprazole (PRILOSEC) 40 MG capsule, Take 40 mg by mouth daily., Disp: , Rfl:  .  oxyCODONE (OXY IR/ROXICODONE) 5 MG immediate release tablet, Take 1 tablet (5 mg total) every 6 (six) hours as needed by mouth for severe pain., Disp: 30 tablet, Rfl: 0 .  oxyCODONE (OXYCONTIN) 10 mg 12 hr tablet, Take 1 tablet (10 mg total) 2 (two) times daily by mouth., Disp: 20 tablet, Rfl: 0 .  predniSONE (STERAPRED UNI-PAK 21 TAB) 10 MG (21) TBPK tablet, Take 6 tabs first day, 5 tab on day 2, then 4 on day 3rd, 3 tabs on day 4th , 2 tab on day 5th, and 1 tab on 6th day., Disp: 21 tablet, Rfl: 0 .  tadalafil (CIALIS) 10 MG tablet, Take 10 mg by mouth daily as needed for erectile dysfunction., Disp: , Rfl:   Physical exam:  Vitals:   12/10/16 1253  BP: (!) 145/82  Pulse: 94  Resp: 18  Temp: 98.3 F (36.8 C)  TempSrc: Tympanic  SpO2: 100%  Weight: 129 lb 1.6 oz (58.6 kg)   Physical Exam  Constitutional: He is oriented to person, place, and time and well-developed, well-nourished, and in no distress. No distress.  HENT:  Head: Normocephalic and atraumatic.  Eyes: Conjunctivae are normal. Pupils are equal, round, and reactive to light.  Neck: Normal range of motion. Neck supple.  Cardiovascular: Normal rate and regular rhythm.  No murmur heard. Pulmonary/Chest: Effort normal. No respiratory distress. He has wheezes. He has no rales. He exhibits no tenderness.  Abdominal: Soft. Bowel sounds are normal. He exhibits no distension.  Musculoskeletal: Normal range of motion.    Lymphadenopathy:    He has cervical adenopathy.  Neurological: He is alert and oriented to person, place, and time.  Skin: Skin is warm and dry.  Psychiatric: Affect normal.    LABs  CMP Latest Ref Rng & Units 12/07/2016  Glucose 65 - 99 mg/dL 130(H)  BUN 6 - 20 mg/dL 13  Creatinine 0.61 - 1.24 mg/dL 0.68  Sodium 135 - 145 mmol/L 125(L)  Potassium 3.5 - 5.1 mmol/L 3.4(L)  Chloride 101 - 111 mmol/L 86(L)  CO2 22 - 32 mmol/L 28  Calcium 8.9 - 10.3 mg/dL 8.8(L)  Total Protein 6.5 - 8.1 g/dL 6.4(L)  Total Bilirubin 0.3 - 1.2 mg/dL 0.7  Alkaline Phos 38 - 126 U/L 89  AST 15 - 41 U/L 31  ALT 17 - 63 U/L  20   CBC Latest Ref Rng & Units 12/07/2016  WBC 3.8 - 10.6 K/uL 7.5  Hemoglobin 13.0 - 18.0 g/dL 13.4  Hematocrit 40.0 - 52.0 % 39.1(L)  Platelets 150 - 440 K/uL 318    Dg Chest 2 View  Result Date: 12/05/2016 CLINICAL DATA:  Shortness of breath for 2-3 days.  Pulmonary mass. EXAM: CHEST  2 VIEW COMPARISON:  Chest radiograph 12/03/2017 FINDINGS: The cardiomediastinal silhouette is distorted by a known left pulmonary mass. Fullness of bilateral hilar regions. There diffusely increased interstitial markings, right more than left. There has been interval development of probable right pleural effusion. No evidence of pneumothorax. Bilateral apical pleural thickening. Stable spinal fusion across moderate lower thoracic level compression fracture. Lower cervical fusion noted. Soft tissues are grossly normal. IMPRESSION: Interval development of increased interstitial markings, which may be seen with interstitial pulmonary edema or hemorrhage. Interval development of right pleural effusion. Known left pulmonary mass. Electronically Signed   By: Fidela Salisbury M.D.   On: 12/05/2016 07:48   Ct Chest W Contrast  Result Date: 11/23/2016 CLINICAL DATA:  Cough, loss of voice for several months, malaise EXAM: CT CHEST WITH CONTRAST TECHNIQUE: Multidetector CT imaging of the chest was  performed during intravenous contrast administration. CONTRAST:  47mL ISOVUE-300 IOPAMIDOL (ISOVUE-300) INJECTION 61% COMPARISON:  Chest x-ray of 11/21/2014 FINDINGS: Cardiovascular: The heart is within normal limits in size. Diffuse coronary artery calcifications are present. Moderate abdominal aortic atherosclerosis is noted. The mid ascending thoracic aorta measures 34 mm in diameter. Mediastinum/Nodes: There is massive mediastinal and left hilar adenopathy present. A subcarinal node measures 29 mm in diameter. A right pretracheal node measures 28 mm in diameter within anterior prevascular node of 29 mm in diameter. Left hilar nodes are difficult to measure accurately due to volume loss. Adenopathy extends in the left paratracheal, left supraclavicular region, and left lower neck as well. Lungs/Pleura: There is a large rounded mass within much of the left lower lobe posterior medially. On image 81 series 2 this mass measures 8.4 x 11.8 cm with left hilar and mediastinal extension. This is consistent with a large left lower lobe carcinoma. Multiple primarily ground-glass nodules are throughout the left lung consistent with ipsilateral metastases. No definite right lung nodule is seen. No pleural effusion is noted. This mass causes extrinsic compression and narrowing of the left main stem bronchus as well as the bronchus to the left lower lobe and lingula. Upper Abdomen: There is suspicion of a pathologically enlarged left periaortic node on the last image, 169 series 2. No hepatic lesion is seen. Small noncalcified gallstones and/or gallbladder debris layer within the gallbladder. Moderate abdominal aortic atherosclerosis is noted. Musculoskeletal: Hardware for fusion of the lower cervical and lower thoracic - upper lumbar spine is noted no lytic or blastic bony lesion is seen with certainty. IMPRESSION: 1. Large mass occupies much of the left lower lobe with mediastinal and left hilar extension consistent with  primary lung carcinoma. 2. Poorly defined ground-glass opacities within the left lung most consistent with ipsilateral lung metastases. 3. Diffuse coronary artery calcifications and changes of abdominal aortic atherosclerosis. 4. Gallbladder sludge and/or gallstones. Electronically Signed   By: Ivar Drape M.D.   On: 11/23/2016 12:13   Mr Brain Wo Contrast  Result Date: 12/05/2016 CLINICAL DATA:  Small cell lung cancer staging EXAM: MRI HEAD WITHOUT CONTRAST TECHNIQUE: Multiplanar, multiecho pulse sequences of the brain and surrounding structures were obtained without intravenous contrast. COMPARISON:  CT 06/05/2014 FINDINGS: Limited study. The  patient was not able to hold still due to extreme discomfort. Limited imaging. No contrast administered Axial diffusion-weighted imaging is diagnostic. No acute infarct. No areas of restricted diffusion. Ventricle size normal.  No mass or fluid collection identified. IMPRESSION: Limited study. The patient was not able to complete the study and minimal imaging was performed. Allowing for this no significant abnormality was identified. Electronically Signed   By: Franchot Gallo M.D.   On: 12/05/2016 13:24   Ct Abdomen Pelvis W Contrast  Result Date: 12/05/2016 CLINICAL DATA:  History of lung cancer. Shortness-of-breath with nonproductive cough and hoarseness. Oxygen dependent. Heavy smoker. EXAM: CT ABDOMEN AND PELVIS WITH CONTRAST TECHNIQUE: Multidetector CT imaging of the abdomen and pelvis was performed using the standard protocol following bolus administration of intravenous contrast. CONTRAST:  155mL ISOVUE-300 IOPAMIDOL (ISOVUE-300) INJECTION 61% COMPARISON:  Chest CT 11/23/2016 FINDINGS: Lower chest: No significant change in known large left lower lobe lung mass presumed to be primary bronchogenic carcinoma. Adjacent small nodules within the left lower lobe likely metastatic disease. Patchy bilateral nodular airspace process new since the previous exam as the  acuteness suggests infection and much less likely progression of patient's neoplastic disease. Small amount of bilateral pleural fluid right greater than left which is new. Calcified plaque over the left anterior descending and right coronary arteries. Hepatobiliary: 7 small hypodense liver lesions most not well seen on prior exam as the largest is over the right lobe measuring 1.7 cm. This is likely due to metastatic disease. Minimal sludge versus cholelithiasis. Biliary tree is within normal. Pancreas: Within normal. Spleen: Within normal. Adrenals/Urinary Tract: Adrenal glands are normal. Kidneys normal in size without hydronephrosis or nephrolithiasis. Ureters and bladder are normal. Stomach/Bowel: Stomach and small bowel are unremarkable. Appendix is normal. Moderate fecal retention throughout the colon. Vascular/Lymphatic: Moderate calcified plaque over the abdominal aorta and iliac arteries. No definite adenopathy within the abdomen/pelvis. Reproductive: Within normal. Other: No free fluid or focal inflammatory change. Musculoskeletal: Moderate spondylosis of the spine with curvature of the thoracolumbar spine convex right and multilevel disc disease. Fusion hardware is present throughout the visualized thoracolumbar spine which is intact and unchanged. Multiple stable compression fractures over the visualized thoracolumbar spine. IMPRESSION: Stable partially visualized large left lower lobe mass with several small adjacent nodules likely adjacent metastatic disease. Approximately 7 small hypodense liver lesions not well seen on the prior exam with the largest 1.7 cm over the right lobe compatible with metastatic disease. New patchy nodular airspace process over the mid to lower lungs right worse than left as the q.d. suggest infection and much less likely progression of neoplastic disease. New small bilateral pleural effusions right greater than left. Aortic Atherosclerosis (ICD10-I70.0). Atherosclerotic  coronary artery disease. Minimal sludge versus cholelithiasis. Moderate spondylosis of the spine with multilevel disc disease and multiple stable compression fractures. Posterior fusion hardware intact over the thoracolumbar spine. Electronically Signed   By: Marin Olp M.D.   On: 12/05/2016 14:24   Nm Pet Image Initial (pi) Skull Base To Thigh  Result Date: 12/10/2016 CLINICAL DATA:  Initial treatment evaluation for RIGHT lower lobe mass. EXAM: NUCLEAR MEDICINE PET SKULL BASE TO THIGH TECHNIQUE: 1211 mCi F-18 FDG was injected intravenously. Full-ring PET imaging was performed from the skull base to thigh after the radiotracer. CT data was obtained and used for attenuation correction and anatomic localization. FASTING BLOOD GLUCOSE:  Value: 105 mg/dl COMPARISON:  CT 11/23/2016 FINDINGS: NECK No hypermetabolic lymph nodes in the neck. CHEST Large hypermetabolic mass in the  LEFT lower lobe measuring 11 cm x 7 cm has intense peripheral metabolic activity with SUV max of 8.5. Mass extends into the LEFT hilum and mediastinum and surrounds the LEFT lower lobe bronchus extending into the subcarinal nodal station with equal metabolic activity. Large hypermetabolic prevascular lymph node measures 3.0 cm with SUV max equal 7.2. There are bilateral hypermetabolic supraclavicular nodes with a RIGHT supraclavicular node SUV max equal 5.5. There is a perispinal metastasis along the lower thoracic spine on the LEFT SUV max equal 6.3 measuring 20 mm on image 130, series 4. This at the T12 vertebral body level. A high LEFT axillary lymph node l metastasis also noted. ABDOMEN/PELVIS Focal activity at the junction of the IVC and central liver with SUV max equal 4.5. Difficult to define liver lesion vs metastatic lymph node. Favor node. Adrenal glands appear normal. Kidneys are normal. For no hypermetabolic abdominopelvic lymph nodes. SKELETON Multiple foci of skeletal metastasis example lesion in the LEFT sacrum SUV max equal  4.3. Lesions within the RIGHT femoral head and femoral diaphysis. Several rib lesions are present. LEFT femur lesion present with SUV max equal 3.6 . IMPRESSION: 1. Large hypermetabolic mass in the LEFT lower lobe measuring up to 11 cm consists with bronchogenic carcinoma. 2. Direct extension of mass into the mediastinum the surrounding the LEFT lobe bronchus. 3. Extensive mediastinal hilar prevascular and bilateral supraclavicular metastatic adenopathy. Metastatic adenopathy also in the level 5 node extension in LEFT axilla. 4. 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 5. Stage IV bronchogenic carcinoma. Electronically Signed   By: Suzy Bouchard M.D.   On: 12/10/2016 16:38   Dg Chest Portable 1 View  Result Date: 12/03/2016 CLINICAL DATA:  Worsening shortness of breath over the past 6 weeks in patient with a left lower lobe mass. EXAM: PORTABLE CHEST 1 VIEW COMPARISON:  CT chest 11/23/2016. Single-view of the chest with 11/21/2014. FINDINGS: Extensive perihilar airspace disease is present bilaterally. Bulky mediastinal lymphadenopathy is identified in the left lower lobe mass is noted but better visualized on prior CT. Heart size is normal. No pneumothorax or pleural effusion. IMPRESSION: Extensive perihilar airspace disease could be due to pneumonia or edema. Bulky mediastinal lymphadenopathy and left lower lobe mass as seen on prior CT. Electronically Signed   By: Inge Rise M.D.   On: 12/03/2016 09:50   [place] Core Biopsy (lymph Nodes)  Result Date: 12/06/2016 INDICATION: Known left lower lobe lung mass with supraclavicular lymphadenopathy. EXAM: ULTRASOUND-GUIDED LEFT SUPRACLAVICULAR LYMPH NODE BIOPSY. MEDICATIONS: None. ANESTHESIA/SEDATION: None FLUOROSCOPY TIME:  Not applicable COMPLICATIONS: None immediate. PROCEDURE: Informed written consent was obtained from the patient after a thorough discussion of the procedural risks, benefits and alternatives.  All questions were addressed. Maximal Sterile Barrier Technique was utilized including caps, mask, sterile gowns, sterile gloves, sterile drape, hand hygiene and skin antiseptic. A timeout was performed prior to the initiation of the procedure. Utilizing real-time ultrasound guidance and 1% xylocaine as local anesthetic a 17 gauge guiding needle was placed percutaneously into 1 of the left supraclavicular lymph nodes. Multiple 18 gauge core biopsies were then obtained and sent for pathologic evaluation. Dedicated ultrasound imaging was obtained during the needle passes. Guiding needle was then removed and hemostasis obtained at the puncture site. Patient tolerated the procedure well was returned his room in satisfactory condition. IMPRESSION: Successful ultrasound-guided left supraclavicular lymph node biopsy Electronically Signed   By: Inez Catalina M.D.   On: 12/06/2016 15:47   PET scan 12/10/2016  1. Large hypermetabolic mass in the LEFT lower lobe measuring up to 11 cm consists with bronchogenic carcinoma. 2. Direct extension of mass into the mediastinum the surrounding the LEFT lobe bronchus. 3. Extensive mediastinal hilar prevascular and bilateral supraclavicular metastatic adenopathy. Metastatic adenopathy also in the level 5 node extension in LEFT axilla. 4. 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 5. Stage IV bronchogenic carcinoma.  Assessment and plan Patient is a 56 y.o. male present to follow up of recenlty diagnosed small cell lung cancer.  Cancer Staging Small cell lung cancer [facility]) Staging form: Lung, AJCC 8th Edition - Clinical stage from 12/10/2016: Stage IV (cT4, cN3, cM1c) - Signed by Earlie Server, MD on 12/10/2016  1. Small cell lung cancer ([facility])   2. Goals of care, counseling/discussion   3. Tobacco abuse counseling    #Extensive small cell lung cancer, plan carboplatin and Etoposide Q21d x 4 Pathology result and image results discussed  with patient.  I explained to the patient the risks and benefits of carboplatin and Etoposide  chemotherapy including all but not limited to hair loss, mouth sore, nausea, vomiting, low blood counts, bleeding, and risk of life threatening infection and even death, secondary malignancy etc.   Patient voices understanding and wants to proceed with treamtment  # Chemotherapy education; port placement. Hopefully the planned start chemotherapy on the week of 11/26 Antiemetics-Zofran and Compazine; EMLA cream sent to pharmacy # Refer to [place] for palliative radiation of his lung and skeletal lesion.  # Plan zometa monthly.  # He can not complete MRI brain study as he can not hold still long enough. Will obtain CT brain.  # Smoking cessation was discussed with patient and cessation program information was given to patient.    Follow up on 12/20/2016 for evaluation prior to cycle 1 Carbo/Etoposide/onpro  Earlie Server, MD, PhD Hematology Oncology [facility] at [facility] Pager- [phone] 12/10/2016

## 2016-12-10 NOTE — Progress Notes (Signed)
  Oncology Nurse Navigator Documentation  Navigator Location: [facility]Med Onc (12/10/16 1400)   )Navigator Encounter Type: Diagnostic Results;Follow-up Appt (12/10/16 1400)     Confirmed Diagnosis Date: 12/08/16 (12/10/16 1400)               Patient Visit Type: MedOnc (12/10/16 1400) Treatment Phase: Pre-Tx/Tx Discussion (12/10/16 1400) Barriers/Navigation Needs: Transportation;Financial;Education;Coordination of Care (12/10/16 1400) Education: Understanding Cancer/ Treatment Options;Newly Diagnosed Cancer Education;Transport During Treatment;Concerns with Finances/ Eligibility (12/10/16 1400) Interventions: Transportation;Referrals;Coordination of Care;Education (12/10/16 1400) Referrals: Social Work (12/10/16 1400) Coordination of Care: Appts;Radiology;Chemo (12/10/16 1400) Education Method: Verbal;Written (12/10/16 1400)         met with patient during follow up visit with Dr. Tasia Catchings to discuss biopsy results and treatment options. All questions answered at the time of visit. Pt given educational materials regarding diagnosis and info regarding supportive services offered. Upcoming appts reviewed with pt and instructed that vascular surgeons office will call him to schedule his PAC placement. Pt verbalized understanding. Instructed to call with any further questions or needs.        Time Spent with Patient: 90 (12/10/16 1400)

## 2016-12-10 NOTE — Patient Instructions (Signed)
Etoposide, VP-16 injection What is this medicine? ETOPOSIDE, VP-16 (e toe POE side) is a chemotherapy drug. It is used to treat testicular cancer, lung cancer, and other cancers. This medicine may be used for other purposes; ask your health care provider or pharmacist if you have questions. COMMON BRAND NAME(S): Etopophos, Toposar, VePesid What should I tell my health care provider before I take this medicine? They need to know if you have any of these conditions: -infection -kidney disease -liver disease -low blood counts, like low white cell, platelet, or red cell counts -an unusual or allergic reaction to etoposide, other medicines, foods, dyes, or preservatives -pregnant or trying to get pregnant -breast-feeding How should I use this medicine? This medicine is for infusion into a vein. It is administered in a hospital or clinic by a specially trained health care professional. Talk to your pediatrician regarding the use of this medicine in children. Special care may be needed. Overdosage: If you think you have taken too much of this medicine contact a poison control center or emergency room at once. NOTE: This medicine is only for you. Do not share this medicine with others. What if I miss a dose? It is important not to miss your dose. Call your doctor or health care professional if you are unable to keep an appointment. What may interact with this medicine? -aspirin -certain medications for seizures like carbamazepine, phenobarbital, phenytoin, valproic acid -cyclosporine -levamisole -warfarin This list may not describe all possible interactions. Give your health care provider a list of all the medicines, herbs, non-prescription drugs, or dietary supplements you use. Also tell them if you smoke, drink alcohol, or use illegal drugs. Some items may interact with your medicine. What should I watch for while using this medicine? Visit your doctor for checks on your progress. This drug  may make you feel generally unwell. This is not uncommon, as chemotherapy can affect healthy cells as well as cancer cells. Report any side effects. Continue your course of treatment even though you feel ill unless your doctor tells you to stop. In some cases, you may be given additional medicines to help with side effects. Follow all directions for their use. Call your doctor or health care professional for advice if you get a fever, chills or sore throat, or other symptoms of a cold or flu. Do not treat yourself. This drug decreases your body's ability to fight infections. Try to avoid being around people who are sick. This medicine may increase your risk to bruise or bleed. Call your doctor or health care professional if you notice any unusual bleeding. Talk to your doctor about your risk of cancer. You may be more at risk for certain types of cancers if you take this medicine. Do not become pregnant while taking this medicine or for at least 6 months after stopping it. Women should inform their doctor if they wish to become pregnant or think they might be pregnant. Women of child-bearing potential will need to have a negative pregnancy test before starting this medicine. There is a potential for serious side effects to an unborn child. Talk to your health care professional or pharmacist for more information. Do not breast-feed an infant while taking this medicine. Men must use a latex condom during sexual contact with a woman while taking this medicine and for at least 4 months after stopping it. A latex condom is needed even if you have had a vasectomy. Contact your doctor right away if your partner becomes pregnant. Do   not donate sperm while taking this medicine and for at least 4 months after you stop taking this medicine. Men should inform their doctors if they wish to father a child. This medicine may lower sperm counts. What side effects may I notice from receiving this medicine? Side effects that  you should report to your doctor or health care professional as soon as possible: -allergic reactions like skin rash, itching or hives, swelling of the face, lips, or tongue -low blood counts - this medicine may decrease the number of white blood cells, red blood cells and platelets. You may be at increased risk for infections and bleeding. -signs of infection - fever or chills, cough, sore throat, pain or difficulty passing urine -signs of decreased platelets or bleeding - bruising, pinpoint red spots on the skin, black, tarry stools, blood in the urine -signs of decreased red blood cells - unusually weak or tired, fainting spells, lightheadedness -breathing problems -changes in vision -mouth or throat sores or ulcers -pain, redness, swelling or irritation at the injection site -pain, tingling, numbness in the hands or feet -redness, blistering, peeling or loosening of the skin, including inside the mouth -seizures -vomiting Side effects that usually do not require medical attention (report to your doctor or health care professional if they continue or are bothersome): -diarrhea -hair loss -loss of appetite -nausea -stomach pain This list may not describe all possible side effects. Call your doctor for medical advice about side effects. You may report side effects to FDA at 1-800-FDA-1088. Where should I keep my medicine? This drug is given in a hospital or clinic and will not be stored at home. NOTE: This sheet is a summary. It may not cover all possible information. If you have questions about this medicine, talk to your doctor, pharmacist, or health care provider.  2018 Elsevier/Gold Standard (2015-01-03 11:53:23) Carboplatin injection What is this medicine? CARBOPLATIN (KAR boe pla tin) is a chemotherapy drug. It targets fast dividing cells, like cancer cells, and causes these cells to die. This medicine is used to treat ovarian cancer and many other cancers. This medicine may be  used for other purposes; ask your health care provider or pharmacist if you have questions. COMMON BRAND NAME(S): Paraplatin What should I tell my health care provider before I take this medicine? They need to know if you have any of these conditions: -blood disorders -hearing problems -kidney disease -recent or ongoing radiation therapy -an unusual or allergic reaction to carboplatin, cisplatin, other chemotherapy, other medicines, foods, dyes, or preservatives -pregnant or trying to get pregnant -breast-feeding How should I use this medicine? This drug is usually given as an infusion into a vein. It is administered in a hospital or clinic by a specially trained health care professional. Talk to your pediatrician regarding the use of this medicine in children. Special care may be needed. Overdosage: If you think you have taken too much of this medicine contact a poison control center or emergency room at once. NOTE: This medicine is only for you. Do not share this medicine with others. What if I miss a dose? It is important not to miss a dose. Call your doctor or health care professional if you are unable to keep an appointment. What may interact with this medicine? -medicines for seizures -medicines to increase blood counts like filgrastim, pegfilgrastim, sargramostim -some antibiotics like amikacin, gentamicin, neomycin, streptomycin, tobramycin -vaccines Talk to your doctor or health care professional before taking any of these medicines: -acetaminophen -aspirin -ibuprofen -ketoprofen -naproxen  This list may not describe all possible interactions. Give your health care provider a list of all the medicines, herbs, non-prescription drugs, or dietary supplements you use. Also tell them if you smoke, drink alcohol, or use illegal drugs. Some items may interact with your medicine. What should I watch for while using this medicine? Your condition will be monitored carefully while you are  receiving this medicine. You will need important blood work done while you are taking this medicine. This drug may make you feel generally unwell. This is not uncommon, as chemotherapy can affect healthy cells as well as cancer cells. Report any side effects. Continue your course of treatment even though you feel ill unless your doctor tells you to stop. In some cases, you may be given additional medicines to help with side effects. Follow all directions for their use. Call your doctor or health care professional for advice if you get a fever, chills or sore throat, or other symptoms of a cold or flu. Do not treat yourself. This drug decreases your body's ability to fight infections. Try to avoid being around people who are sick. This medicine may increase your risk to bruise or bleed. Call your doctor or health care professional if you notice any unusual bleeding. Be careful brushing and flossing your teeth or using a toothpick because you may get an infection or bleed more easily. If you have any dental work done, tell your dentist you are receiving this medicine. Avoid taking products that contain aspirin, acetaminophen, ibuprofen, naproxen, or ketoprofen unless instructed by your doctor. These medicines may hide a fever. Do not become pregnant while taking this medicine. Women should inform their doctor if they wish to become pregnant or think they might be pregnant. There is a potential for serious side effects to an unborn child. Talk to your health care professional or pharmacist for more information. Do not breast-feed an infant while taking this medicine. What side effects may I notice from receiving this medicine? Side effects that you should report to your doctor or health care professional as soon as possible: -allergic reactions like skin rash, itching or hives, swelling of the face, lips, or tongue -signs of infection - fever or chills, cough, sore throat, pain or difficulty passing  urine -signs of decreased platelets or bleeding - bruising, pinpoint red spots on the skin, black, tarry stools, nosebleeds -signs of decreased red blood cells - unusually weak or tired, fainting spells, lightheadedness -breathing problems -changes in hearing -changes in vision -chest pain -high blood pressure -low blood counts - This drug may decrease the number of white blood cells, red blood cells and platelets. You may be at increased risk for infections and bleeding. -nausea and vomiting -pain, swelling, redness or irritation at the injection site -pain, tingling, numbness in the hands or feet -problems with balance, talking, walking -trouble passing urine or change in the amount of urine Side effects that usually do not require medical attention (report to your doctor or health care professional if they continue or are bothersome): -hair loss -loss of appetite -metallic taste in the mouth or changes in taste This list may not describe all possible side effects. Call your doctor for medical advice about side effects. You may report side effects to FDA at 1-800-FDA-1088. Where should I keep my medicine? This drug is given in a hospital or clinic and will not be stored at home. NOTE: This sheet is a summary. It may not cover all possible information. If you have  questions about this medicine, talk to your doctor, pharmacist, or health care provider.  2018 Elsevier/Gold Standard (2007-04-18 14:38:05)

## 2016-12-12 DIAGNOSIS — Z7189 Other specified counseling: Secondary | ICD-10-CM | POA: Insufficient documentation

## 2016-12-13 ENCOUNTER — Other Ambulatory Visit (INDEPENDENT_AMBULATORY_CARE_PROVIDER_SITE_OTHER): Payer: Self-pay | Admitting: Vascular Surgery

## 2016-12-13 ENCOUNTER — Encounter (INDEPENDENT_AMBULATORY_CARE_PROVIDER_SITE_OTHER): Payer: Self-pay

## 2016-12-14 ENCOUNTER — Inpatient Hospital Stay: Payer: Medicare Other

## 2016-12-14 ENCOUNTER — Ambulatory Visit: Payer: Medicare Other

## 2016-12-14 NOTE — Progress Notes (Signed)
START ON PATHWAY REGIMEN - Small Cell Lung     A cycle is every 21 days:     Etoposide      Carboplatin   **Always confirm dose/schedule in your pharmacy ordering system**    Patient Characteristics: Extensive Stage, First Line Stage Classification: Extensive AJCC T Category: T4 AJCC N Category: N3 AJCC M Category: M1c AJCC 8 Stage Grouping: IVB Line of therapy: First Line Would you be surprised if this patient died  in the next year<= I would be surprised if this patient died in the next year Intent of Therapy: Non-Curative / Palliative Intent, Discussed with Patient

## 2016-12-15 ENCOUNTER — Ambulatory Visit
Admission: RE | Admit: 2016-12-15 | Discharge: 2016-12-15 | Disposition: A | Discharge status: Home / Self Care | Payer: Medicare Other | Source: Ambulatory Visit | Attending: Oncology | Admitting: Oncology

## 2016-12-15 DIAGNOSIS — C3490 Malignant neoplasm of unspecified part of unspecified bronchus or lung: Secondary | ICD-10-CM | POA: Diagnosis present

## 2016-12-15 MED ORDER — IOPAMIDOL (ISOVUE-300) INJECTION 61%
75.0000 mL | Freq: Once | INTRAVENOUS | Status: AC | PRN
  Administered 2016-12-15: 75 mL via INTRAVENOUS

## 2016-12-19 MED ORDER — CEFAZOLIN SODIUM-DEXTROSE 2-4 GM/100ML-% IV SOLN: 2.0000 g | Freq: Once | INTRAVENOUS | Status: DC

## 2016-12-20 ENCOUNTER — Other Ambulatory Visit: Payer: Self-pay | Admitting: *Deleted

## 2016-12-20 ENCOUNTER — Ambulatory Visit
Admission: RE | Admit: 2016-12-20 | Discharge: 2016-12-20 | Disposition: A | Discharge status: Home / Self Care | Payer: Medicare Other | Source: Ambulatory Visit | Attending: Vascular Surgery | Admitting: Vascular Surgery

## 2016-12-20 ENCOUNTER — Ambulatory Visit: Payer: Medicare Other | Admitting: Radiation Oncology

## 2016-12-20 ENCOUNTER — Ambulatory Visit: Payer: Medicare Other

## 2016-12-20 ENCOUNTER — Other Ambulatory Visit: Payer: Medicare Other

## 2016-12-20 ENCOUNTER — Telehealth: Payer: Self-pay | Admitting: *Deleted

## 2016-12-20 ENCOUNTER — Ambulatory Visit: Payer: Medicare Other | Admitting: Oncology

## 2016-12-20 ENCOUNTER — Encounter
Admission: RE | Disposition: A | Discharge status: Home / Self Care | Payer: Self-pay | Source: Ambulatory Visit | Attending: Vascular Surgery

## 2016-12-20 DIAGNOSIS — Z8249 Family history of ischemic heart disease and other diseases of the circulatory system: Secondary | ICD-10-CM | POA: Insufficient documentation

## 2016-12-20 DIAGNOSIS — I10 Essential (primary) hypertension: Secondary | ICD-10-CM | POA: Insufficient documentation

## 2016-12-20 DIAGNOSIS — K219 Gastro-esophageal reflux disease without esophagitis: Secondary | ICD-10-CM | POA: Insufficient documentation

## 2016-12-20 DIAGNOSIS — Z886 Allergy status to analgesic agent status: Secondary | ICD-10-CM | POA: Diagnosis not present

## 2016-12-20 DIAGNOSIS — N529 Male erectile dysfunction, unspecified: Secondary | ICD-10-CM | POA: Insufficient documentation

## 2016-12-20 DIAGNOSIS — D649 Anemia, unspecified: Secondary | ICD-10-CM | POA: Insufficient documentation

## 2016-12-20 DIAGNOSIS — C3490 Malignant neoplasm of unspecified part of unspecified bronchus or lung: Secondary | ICD-10-CM

## 2016-12-20 DIAGNOSIS — R05 Cough: Secondary | ICD-10-CM | POA: Insufficient documentation

## 2016-12-20 DIAGNOSIS — F17210 Nicotine dependence, cigarettes, uncomplicated: Secondary | ICD-10-CM | POA: Insufficient documentation

## 2016-12-20 DIAGNOSIS — Z9889 Other specified postprocedural states: Secondary | ICD-10-CM | POA: Diagnosis not present

## 2016-12-20 DIAGNOSIS — Z79899 Other long term (current) drug therapy: Secondary | ICD-10-CM | POA: Insufficient documentation

## 2016-12-20 DIAGNOSIS — Z9981 Dependence on supplemental oxygen: Secondary | ICD-10-CM | POA: Diagnosis not present

## 2016-12-20 DIAGNOSIS — Z888 Allergy status to other drugs, medicaments and biological substances status: Secondary | ICD-10-CM | POA: Insufficient documentation

## 2016-12-20 DIAGNOSIS — Z716 Tobacco abuse counseling: Secondary | ICD-10-CM | POA: Insufficient documentation

## 2016-12-20 HISTORY — DX: Malignant (primary) neoplasm, unspecified: C80.1

## 2016-12-20 HISTORY — PX: PORTA CATH INSERTION: CATH118285

## 2016-12-20 SURGERY — PORTA CATH INSERTION
Anesthesia: Moderate Sedation

## 2016-12-20 MED ORDER — LIDOCAINE-PRILOCAINE 2.5-2.5 % EX CREA: TOPICAL_CREAM | CUTANEOUS | 3 refills | Status: AC

## 2016-12-20 MED ORDER — LIDOCAINE-EPINEPHRINE (PF) 1 %-1:200000 IJ SOLN
INTRAMUSCULAR | Status: AC
  Filled 2016-12-20: qty 30

## 2016-12-20 MED ORDER — ONDANSETRON HCL 8 MG PO TABS: 8.0000 mg | ORAL_TABLET | Freq: Two times a day (BID) | ORAL | 1 refills | Status: AC | PRN

## 2016-12-20 MED ORDER — ONDANSETRON HCL 4 MG/2ML IJ SOLN: 4.0000 mg | Freq: Four times a day (QID) | INTRAMUSCULAR | Status: DC | PRN

## 2016-12-20 MED ORDER — FENTANYL CITRATE (PF) 100 MCG/2ML IJ SOLN
INTRAMUSCULAR | Status: AC
  Filled 2016-12-20: qty 2

## 2016-12-20 MED ORDER — SODIUM CHLORIDE 0.9 % IV SOLN
INTRAVENOUS | Status: DC
  Administered 2016-12-20: 1000 mL via INTRAVENOUS

## 2016-12-20 MED ORDER — HEPARIN (PORCINE) IN NACL 2-0.9 UNIT/ML-% IJ SOLN
INTRAMUSCULAR | Status: AC
  Filled 2016-12-20: qty 500

## 2016-12-20 MED ORDER — FENTANYL CITRATE (PF) 100 MCG/2ML IJ SOLN
INTRAMUSCULAR | Status: DC | PRN
  Administered 2016-12-20 (×4): 50 ug via INTRAVENOUS

## 2016-12-20 MED ORDER — MIDAZOLAM HCL 2 MG/2ML IJ SOLN
INTRAMUSCULAR | Status: AC
  Filled 2016-12-20: qty 2

## 2016-12-20 MED ORDER — MIDAZOLAM HCL 2 MG/2ML IJ SOLN
INTRAMUSCULAR | Status: DC | PRN
  Administered 2016-12-20 (×2): 2 mg via INTRAVENOUS
  Administered 2016-12-20: 1 mg via INTRAVENOUS

## 2016-12-20 MED ORDER — HYDROMORPHONE HCL 1 MG/ML IJ SOLN: 1.0000 mg | Freq: Once | INTRAMUSCULAR | Status: DC | PRN

## 2016-12-20 MED ORDER — PROCHLORPERAZINE MALEATE 10 MG PO TABS: 10.0000 mg | ORAL_TABLET | Freq: Four times a day (QID) | ORAL | 1 refills | Status: AC | PRN

## 2016-12-20 MED ORDER — SODIUM CHLORIDE 0.9 % IR SOLN
Freq: Once | Status: DC
  Filled 2016-12-20: qty 2

## 2016-12-20 MED ORDER — MIDAZOLAM HCL 5 MG/5ML IJ SOLN
INTRAMUSCULAR | Status: AC
  Filled 2016-12-20: qty 5

## 2016-12-20 SURGICAL SUPPLY — 11 items
DERMABOND ADVANCED (GAUZE/BANDAGES/DRESSINGS) ×2
DERMABOND ADVANCED .7 DNX12 (GAUZE/BANDAGES/DRESSINGS) ×1 IMPLANT
KIT PORT POWER 8FR ISP CVUE (Miscellaneous) ×3 IMPLANT
PACK ANGIOGRAPHY (CUSTOM PROCEDURE TRAY) ×3 IMPLANT
PAD GROUND ADULT SPLIT (MISCELLANEOUS) ×3 IMPLANT
PENCIL ELECTRO HAND CTR (MISCELLANEOUS) ×3 IMPLANT
SPONGE XRAY 4X4 16PLY STRL (MISCELLANEOUS) ×3 IMPLANT
SUT MNCRL AB 4-0 PS2 18 (SUTURE) ×3 IMPLANT
SUT PROLENE 0 CT 1 30 (SUTURE) ×3 IMPLANT
SUTURE VIC 3-0 (SUTURE) ×3 IMPLANT
WIRE J 3MM .035X145CM (WIRE) ×3 IMPLANT

## 2016-12-20 NOTE — Telephone Encounter (Signed)
Pt called in to request prescriptions be called into pharmacy for EMLA cream and nausea medications. All take home meds that were signed on treatment plan have been released to pt's pharmacy. Pt has been made aware.

## 2016-12-20 NOTE — Op Note (Signed)
      [facility]       Operative Note  Date: 12/20/2016  Preoperative diagnosis:  1. Lung cancer  Postoperative diagnosis:  Same as above  Procedures: #1. Ultrasound guidance for vascular access to the right internal jugular vein. #2. Fluoroscopic guidance for placement of catheter. #3. Placement of CT compatible Port-A-Cath, right internal jugular vein.  Surgeon: Leotis Pain, MD.   Anesthesia: Local with moderate conscious sedation for approximately 15  minutes using 5 mg of Versed and 200 mcg of Fentanyl  Fluoroscopy time: less than 1 minute  Contrast used: 0  Estimated blood loss: 10 cc  Indication for the procedure:  The patient is a 56 y.o.male with metastatic lung cancer.  The patient needs a Port-A-Cath for durable venous access, chemotherapy, lab draws, and CT scans. We are asked to place this. Risks and benefits were discussed and informed consent was obtained.  Description of procedure: The patient was brought to the vascular and interventional radiology suite.  Moderate conscious sedation was administered throughout the procedure during a face to face encounter with the patient with my supervision of the RN administering medicines and monitoring the patient's vital signs, pulse oximetry, telemetry and mental status throughout from the start of the procedure until the patient was taken to the recovery room. The right neck chest and shoulder were sterilely prepped and draped, and a sterile surgical field was created. Ultrasound was used to help visualize a patent right internal jugular vein. This was then accessed under direct ultrasound guidance without difficulty with the Seldinger needle and a permanent image was recorded. A J-wire was placed. After skin nick and dilatation, the peel-away sheath was then placed over the wire. I then anesthetized an area under the clavicle approximately 1-2 fingerbreadths. A transverse incision was created and an inferior  pocket was created with electrocautery and blunt dissection. The port was then brought onto the field, placed into the pocket and secured to the chest wall with 2 Prolene sutures. The catheter was connected to the port and tunneled from the subclavicular incision to the access site. Fluoroscopic guidance was then used to cut the catheter to an appropriate length. The catheter was then placed through the peel-away sheath and the peel-away sheath was removed. The catheter tip was parked in excellent location under fluorocoscopic guidance in the cavoatrial junction. The pocket was then irrigated with antibiotic impregnated saline and the wound was closed with a running 3-0 Vicryl and a 4-0 Monocryl. The access incision was closed with a single 4-0 Monocryl. The Huber needle was used to withdraw blood and flush the port with heparinized saline. Dermabond was then placed as a dressing. The patient tolerated the procedure well and was taken to the recovery room in stable condition.   Leotis Pain 12/20/2016 10:16 AM   This note was created with Dragon Medical transcription system. Any errors in dictation are purely unintentional.

## 2016-12-20 NOTE — H&P (Signed)
[facility] History & Physical Update  The patient was interviewed and re-examined.  The patient's previous History and Physical has been reviewed and is unchanged.  There is no change in the plan of care. We plan to proceed with the scheduled procedure.  Leotis Pain, MD  12/20/2016, 8:15 AM

## 2016-12-21 ENCOUNTER — Inpatient Hospital Stay (HOSPITAL_BASED_OUTPATIENT_CLINIC_OR_DEPARTMENT_OTHER): Payer: Medicare Other | Admitting: Oncology

## 2016-12-21 ENCOUNTER — Encounter: Payer: Self-pay | Admitting: Oncology

## 2016-12-21 ENCOUNTER — Ambulatory Visit
Admission: RE | Admit: 2016-12-21 | Discharge: 2016-12-21 | Disposition: A | Discharge status: Home / Self Care | Payer: Medicare Other | Source: Ambulatory Visit | Attending: Radiation Oncology | Admitting: Radiation Oncology

## 2016-12-21 ENCOUNTER — Inpatient Hospital Stay: Payer: Medicare Other

## 2016-12-21 ENCOUNTER — Other Ambulatory Visit: Payer: Self-pay

## 2016-12-21 ENCOUNTER — Encounter: Payer: Self-pay | Admitting: *Deleted

## 2016-12-21 VITALS — BP 123/69 | HR 87 | Temp 98.5°F | Wt 125.2 lb

## 2016-12-21 DIAGNOSIS — F17210 Nicotine dependence, cigarettes, uncomplicated: Secondary | ICD-10-CM

## 2016-12-21 DIAGNOSIS — C3432 Malignant neoplasm of lower lobe, left bronchus or lung: Secondary | ICD-10-CM | POA: Insufficient documentation

## 2016-12-21 DIAGNOSIS — C7951 Secondary malignant neoplasm of bone: Secondary | ICD-10-CM

## 2016-12-21 DIAGNOSIS — C778 Secondary and unspecified malignant neoplasm of lymph nodes of multiple regions: Secondary | ICD-10-CM | POA: Diagnosis not present

## 2016-12-21 DIAGNOSIS — Z5111 Encounter for antineoplastic chemotherapy: Secondary | ICD-10-CM | POA: Diagnosis not present

## 2016-12-21 DIAGNOSIS — E222 Syndrome of inappropriate secretion of antidiuretic hormone: Secondary | ICD-10-CM | POA: Diagnosis not present

## 2016-12-21 DIAGNOSIS — Z716 Tobacco abuse counseling: Secondary | ICD-10-CM

## 2016-12-21 DIAGNOSIS — M549 Dorsalgia, unspecified: Secondary | ICD-10-CM | POA: Diagnosis not present

## 2016-12-21 DIAGNOSIS — K769 Liver disease, unspecified: Secondary | ICD-10-CM | POA: Diagnosis not present

## 2016-12-21 DIAGNOSIS — Z9221 Personal history of antineoplastic chemotherapy: Secondary | ICD-10-CM | POA: Diagnosis not present

## 2016-12-21 DIAGNOSIS — F1010 Alcohol abuse, uncomplicated: Secondary | ICD-10-CM | POA: Insufficient documentation

## 2016-12-21 DIAGNOSIS — C770 Secondary and unspecified malignant neoplasm of lymph nodes of head, face and neck: Secondary | ICD-10-CM

## 2016-12-21 DIAGNOSIS — C3490 Malignant neoplasm of unspecified part of unspecified bronchus or lung: Secondary | ICD-10-CM

## 2016-12-21 DIAGNOSIS — Z79899 Other long term (current) drug therapy: Secondary | ICD-10-CM | POA: Diagnosis not present

## 2016-12-21 DIAGNOSIS — E871 Hypo-osmolality and hyponatremia: Secondary | ICD-10-CM

## 2016-12-21 LAB — COMPREHENSIVE METABOLIC PANEL
ALBUMIN: 3.2 g/dL — AB (ref 3.5–5.0)
ALK PHOS: 95 U/L (ref 38–126)
ALT: 9 U/L — ABNORMAL LOW (ref 17–63)
ANION GAP: 8 (ref 5–15)
AST: 28 U/L (ref 15–41)
BUN: 8 mg/dL (ref 6–20)
CO2: 25 mmol/L (ref 22–32)
Calcium: 8.8 mg/dL — ABNORMAL LOW (ref 8.9–10.3)
Chloride: 88 mmol/L — ABNORMAL LOW (ref 101–111)
Creatinine, Ser: 0.35 mg/dL — ABNORMAL LOW (ref 0.61–1.24)
GFR calc Af Amer: >60 mL/min (ref >60)
GFR calc non Af Amer: >60 mL/min (ref >60)
GLUCOSE: 101 mg/dL — AB (ref 65–99)
POTASSIUM: 3.7 mmol/L (ref 3.5–5.1)
SODIUM: 121 mmol/L — AB (ref 135–145)
Total Bilirubin: 0.6 mg/dL (ref 0.3–1.2)
Total Protein: 6.5 g/dL (ref 6.5–8.1)

## 2016-12-21 LAB — CBC WITH DIFFERENTIAL/PLATELET
BASOS ABS: 0.1 10*3/uL (ref 0–0.1)
BASOS PCT: 1 %
EOS ABS: 0.1 10*3/uL (ref 0–0.7)
Eosinophils Relative: 1 %
HCT: 33.5 % — ABNORMAL LOW (ref 40.0–52.0)
HEMOGLOBIN: 11.8 g/dL — AB (ref 13.0–18.0)
Lymphocytes Relative: 5 %
Lymphs Abs: 0.4 10*3/uL — ABNORMAL LOW (ref 1.0–3.6)
MCH: 32.7 pg (ref 26.0–34.0)
MCHC: 35.1 g/dL (ref 32.0–36.0)
MCV: 93.2 fL (ref 80.0–100.0)
MONOS PCT: 10 %
Monocytes Absolute: 0.9 10*3/uL (ref 0.2–1.0)
NEUTROS PCT: 83 %
Neutro Abs: 7.3 10*3/uL — ABNORMAL HIGH (ref 1.4–6.5)
Platelets: 344 10*3/uL (ref 150–440)
RBC: 3.59 MIL/uL — ABNORMAL LOW (ref 4.40–5.90)
RDW: 15.3 % — AB (ref 11.5–14.5)
WBC: 8.8 10*3/uL (ref 3.8–10.6)

## 2016-12-21 MED ORDER — SODIUM CHLORIDE 0.9 % IV SOLN
547.0000 mg | Freq: Once | INTRAVENOUS | Status: AC
  Administered 2016-12-21: 550 mg via INTRAVENOUS
  Filled 2016-12-21: qty 55

## 2016-12-21 MED ORDER — HEPARIN SOD (PORK) LOCK FLUSH 100 UNIT/ML IV SOLN: 500.0000 [IU] | Freq: Once | INTRAVENOUS | Status: DC | PRN

## 2016-12-21 MED ORDER — SODIUM CHLORIDE 0.9% FLUSH
10.0000 mL | INTRAVENOUS | Status: DC | PRN
  Administered 2016-12-21: 10 mL via INTRAVENOUS
  Filled 2016-12-21: qty 10

## 2016-12-21 MED ORDER — OXYCODONE HCL 10 MG PO TABS: 10.0000 mg | ORAL_TABLET | Freq: Four times a day (QID) | ORAL | 0 refills | Status: DC | PRN

## 2016-12-21 MED ORDER — SODIUM CHLORIDE 0.9 % IV SOLN
Freq: Once | INTRAVENOUS | Status: AC
  Administered 2016-12-21: 10:00:00 via INTRAVENOUS
  Filled 2016-12-21: qty 1000

## 2016-12-21 MED ORDER — SODIUM CHLORIDE 0.9 % IV SOLN
100.0000 mg/m2 | Freq: Once | INTRAVENOUS | Status: AC
  Administered 2016-12-21: 160 mg via INTRAVENOUS
  Filled 2016-12-21: qty 8

## 2016-12-21 MED ORDER — PALONOSETRON HCL INJECTION 0.25 MG/5ML
0.2500 mg | Freq: Once | INTRAVENOUS | Status: AC
  Administered 2016-12-21: 0.25 mg via INTRAVENOUS
  Filled 2016-12-21: qty 5

## 2016-12-21 MED ORDER — SODIUM CHLORIDE 1 G PO TABS: 1.0000 g | ORAL_TABLET | Freq: Two times a day (BID) | ORAL | 0 refills | Status: AC

## 2016-12-21 MED ORDER — DEXAMETHASONE SODIUM PHOSPHATE 10 MG/ML IJ SOLN
10.0000 mg | Freq: Once | INTRAMUSCULAR | Status: AC
  Administered 2016-12-21: 10 mg via INTRAVENOUS
  Filled 2016-12-21: qty 1

## 2016-12-21 MED ORDER — SODIUM CHLORIDE 0.9 % IV SOLN: 10.0000 mg | Freq: Once | INTRAVENOUS | Status: DC

## 2016-12-21 MED ORDER — HEPARIN SOD (PORK) LOCK FLUSH 100 UNIT/ML IV SOLN
500.0000 [IU] | Freq: Once | INTRAVENOUS | Status: AC
  Administered 2016-12-21: 500 [IU] via INTRAVENOUS

## 2016-12-21 NOTE — Progress Notes (Signed)
Radiation Oncology Follow up Note  Name: Steven Davis   Date:   12/21/2016 MRN:  [id number] DOB: [birth year]    This 56 y.o. male presents to the clinic today for follow-up for patient with extensive stage small cell lung cancer.  REFERRING PROVIDER: [place]  HPI: Patient is a 56 year old male originally consult to in the hospital on 12/06/2016. When he presented with increasing shortness of breath had a large lung mass with significant mediastinal adenopathy and evidence of intrapulmonary metastatic disease. He also had supraclavicular adenopathy. Workup revealed multiple liver lesions consistent with metastatic disease. 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 He has been scheduled to start chemotherapy with carboplatinum etoposide and on pro-. He is seen today for consideration of possible palliative radiation therapy to his chest. He is doing fairly well does have significant history of EtOH abuse as well as back pain secondary to instrumentation in the past. He specifically denies cough hemoptysis or chest tightness at this time.  COMPLICATIONS OF TREATMENT: none  FOLLOW UP COMPLIANCE: keeps appointments   PHYSICAL EXAM:  There were no vitals taken for this visit. Frail-appearing wheelchair-bound male in NAD. Well-developed well-nourished patient in NAD. HEENT reveals PERLA, EOMI, discs not visualized.  Oral cavity is clear. No oral mucosal lesions are identified. Neck is clear without evidence of cervical or supraclavicular adenopathy. Lungs are clear to A&P. Cardiac examination is essentially unremarkable with regular rate and rhythm without murmur rub or thrill. Abdomen is benign with no organomegaly or masses noted. Motor sensory and DTR levels are equal and symmetric in the upper and lower extremities. Cranial nerves II through XII are grossly intact. Proprioception is intact. No peripheral  adenopathy or edema is identified. No motor or sensory levels are noted. Crude visual fields are within normal range.  RADIOLOGY RESULTS: PET CT scan reviewed  PLAN: At this time I'm to hold off any palliative radiation therapy. His amount of tumor in his chest is enormous and fields would be prohibitive from radiation therapy. I like to go ahead with couple cycles of chemotherapy to evaluate for response. The was coming down the road if he develops hemoptysis or collapse of his lung furthering his atelectasis. I've asked to see him back in approximately 1-2 months. Also discussed the case with medical oncology. Do not believe there is any role at this time for radiation therapy as stated above.  I would like to take this opportunity to thank you for allowing me to participate in the care of your patient.Armstead Peaks., MD

## 2016-12-21 NOTE — Progress Notes (Signed)
Hematology/Oncology Follow Up Note [facility]  Telephone:([phone] [phone]  Patient Care Team: Valera Castle, MD as PCP - General (Family Medicine) Telford Nab, RN as Registered Nurse   Name of the patient: Steven Davis  [id number]  1960-12-29   REASON FOR VISIT Following up for management of small cell lung cancer.  HISTORY OF PRESENT ILLNESS Patient was seen and examined the bedside. He reports improvement of his breathing. He had a good night's sleep. Continue to have intermittent cough. He had CT and brain MRI done. He couldn't tolerate brain MRI.CT abdomen and pelvis CT abdomen and pelvis showed multiple hypodense liver lesions. He is also scheduled to have ultrasound-guided biopsy of his left supraclavicular lymph node.He tells me that he usually lies on his right side because of chronic back problem. He feels more comfortable breathing today  INTERVAL HISTORY Patient presents to follow up prior to cycle 1 chemotherapy. He has been to chemotherapy class. His daughter accompanied him today. He uses home oxygen as needed. . Still smoking. Reports SOB feels a lot better. Denies nausea, vomiting, headache, dizziness, double vision, chest pain.    Review of Systems  Constitutional: Positive for malaise/fatigue. Negative for chills and fever.  HENT: Negative for hearing loss.   Eyes: Negative for blurred vision.  Respiratory: Positive for cough and shortness of breath. Negative for sputum production.   Cardiovascular: Negative for chest pain.  Gastrointestinal: Negative for heartburn.  Genitourinary: Negative for dysuria.  Musculoskeletal: Positive for back pain. Negative for myalgias.       Left side rib pain.   Skin: Negative for rash.  Neurological: Negative for dizziness.  Endo/Heme/Allergies: Does not bruise/bleed easily.  Psychiatric/Behavioral: Negative for depression.     Allergies  Allergen Reactions  . Aspirin Other  (See Comments)    History of GI bleed  . Benadryl [Diphenhydramine Hcl (Sleep)] Nausea And Vomiting     Past Medical History:  Diagnosis Date  . Anemia   . Cancer ([place])   . Erectile dysfunction   . GERD (gastroesophageal reflux disease)   . Hypertension      Past Surgical History:  Procedure Laterality Date  . BACK SURGERY    . ESOPHAGOGASTRODUODENOSCOPY (EGD) WITH PROPOFOL Left 11/22/2014   Procedure: ESOPHAGOGASTRODUODENOSCOPY (EGD) WITH PROPOFOL;  Surgeon: Manya Silvas, MD;  Location: [facility] ENDOSCOPY;  Service: Endoscopy;  Laterality: Left;  . NECK SURGERY    . PORTA CATH INSERTION N/A 12/20/2016   Procedure: PORTA CATH INSERTION;  Surgeon: Algernon Huxley, MD;  Location: [facility];  Service: Cardiovascular;  Laterality: N/A;    Social History   Socioeconomic History  . Marital status: Single    Spouse name: Not on file  . Number of children: Not on file  . Years of education: Not on file  . Highest education level: Not on file  Social Needs  . Financial resource strain: Not on file  . Food insecurity - worry: Not on file  . Food insecurity - inability: Not on file  . Transportation needs - medical: Not on file  . Transportation needs - non-medical: Not on file  Occupational History  . Not on file  Tobacco Use  . Smoking status: Current Every Day Smoker    Packs/day: 1.00    Types: Cigarettes  . Smokeless tobacco: Never Used  Substance and Sexual Activity  . Alcohol use: Yes    Alcohol/week: 1.2 - 1.8 oz    Types: 2 -  3 Cans of beer per week  . Drug use: No  . Sexual activity: Not on file  Other Topics Concern  . Not on file  Social History Narrative  . Not on file    Family History  Problem Relation Age of Onset  . Hypertension Father      Current Outpatient Medications:  .  albuterol (PROVENTIL HFA;VENTOLIN HFA) 108 (90 BASE) MCG/ACT inhaler, Inhale 2 puffs into the lungs every 6 (six) hours as needed for wheezing or shortness of  breath., Disp: , Rfl:  .  amLODipine (NORVASC) 10 MG tablet, Take 10 mg by mouth daily., Disp: , Rfl:  .  docusate sodium (COLACE) 100 MG capsule, Take 1 capsule (100 mg total) 2 (two) times daily as needed by mouth for mild constipation., Disp: 10 capsule, Rfl: 0 .  feeding supplement, ENSURE ENLIVE, (ENSURE ENLIVE) LIQD, Take 237 mLs 2 (two) times daily between meals by mouth., Disp: 237 mL, Rfl: 12 .  ferrous sulfate 325 (65 FE) MG tablet, Take 325 mg daily by mouth., Disp: , Rfl:  .  Fluticasone-Salmeterol (ADVAIR DISKUS) 250-50 MCG/DOSE AEPB, Inhale 1 puff 2 (two) times daily into the lungs., Disp: 60 each, Rfl: 1 .  lidocaine-prilocaine (EMLA) cream, Apply to affected area once, Disp: 30 g, Rfl: 3 .  lisinopril (PRINIVIL,ZESTRIL) 40 MG tablet, Take 40 mg by mouth daily., Disp: , Rfl:  .  nicotine (NICODERM CQ - DOSED IN MG/24 HOURS) 21 mg/24hr patch, Place 1 patch (21 mg total) daily onto the skin., Disp: 28 patch, Rfl: 0 .  omeprazole (PRILOSEC) 40 MG capsule, Take 40 mg by mouth daily., Disp: , Rfl:  .  ondansetron (ZOFRAN) 8 MG tablet, Take 1 tablet (8 mg total) by mouth 2 (two) times daily as needed for refractory nausea / vomiting. Start on day 3 after carboplatin chemo., Disp: 30 tablet, Rfl: 1 .  oxyCODONE (OXY IR/ROXICODONE) 5 MG immediate release tablet, Take 1 tablet (5 mg total) every 6 (six) hours as needed by mouth for severe pain., Disp: 30 tablet, Rfl: 0 .  oxyCODONE (OXYCONTIN) 10 mg 12 hr tablet, Take 1 tablet (10 mg total) 2 (two) times daily by mouth. (Patient not taking: Reported on 12/20/2016), Disp: 20 tablet, Rfl: 0 .  predniSONE (STERAPRED UNI-PAK 21 TAB) 10 MG (21) TBPK tablet, Take 6 tabs first day, 5 tab on day 2, then 4 on day 3rd, 3 tabs on day 4th , 2 tab on day 5th, and 1 tab on 6th day. (Patient not taking: Reported on 12/20/2016), Disp: 21 tablet, Rfl: 0 .  prochlorperazine (COMPAZINE) 10 MG tablet, Take 1 tablet (10 mg total) by mouth every 6 (six) hours as  needed (Nausea or vomiting)., Disp: 30 tablet, Rfl: 1 .  sodium chloride 1 g tablet, Take 1 tablet (1 g total) by mouth 2 (two) times daily with a meal., Disp: 60 tablet, Rfl: 0 .  tadalafil (CIALIS) 10 MG tablet, Take 10 mg by mouth daily as needed for erectile dysfunction., Disp: , Rfl:  No current facility-administered medications for this visit.   Facility-Administered Medications Ordered in Other Visits:  .  heparin lock flush 100 unit/mL, 500 Units, Intravenous, Once, Earlie Server, MD .  sodium chloride flush (NS) 0.9 % injection 10 mL, 10 mL, Intravenous, PRN, Earlie Server, MD, 10 mL at 12/21/16 0806  Physical exam:  Vitals:   12/21/16 0900  BP: 123/69  Pulse: 87  Temp: 98.5 F (36.9 C)  TempSrc: Tympanic  SpO2:  94%  Weight: 125 lb 3 oz (56.8 kg)   GENERAL: No distress,SKIN:  No rashes or significant lesions  HEAD: Normocephalic, No masses, lesions, tenderness or abnormalities  EYES: Conjunctiva are pink, non icteric ENT: External ears normal ,lips , buccal mucosa, and tongue normal and mucous membranes are moist  LYMPH: palpable left supraclavicular lymphadenopathy, appears larger comparing last examination. LUNGS: Clear to auscultation, no crackles or wheezes HEART: Regular rate & rhythm, no murmurs, no gallops, S1 normal and S2 normal  ABDOMEN: Abdomen soft, non-tender, normal bowel sounds, I MUSCULOSKELETAL: No CVA tenderness and no tenderness on percussion of the back or rib cage.  EXTREMITIES: No edema, no skin discoloration or tenderness NEURO: Alert & oriented, no focal motor/sensory deficits. ECOG 2   CMP Latest Ref Rng & Units 12/21/2016  Glucose 65 - 99 mg/dL 101(H)  BUN 6 - 20 mg/dL 8  Creatinine 0.61 - 1.24 mg/dL 0.35(L)  Sodium 135 - 145 mmol/L 121(L)  Potassium 3.5 - 5.1 mmol/L 3.7  Chloride 101 - 111 mmol/L 88(L)  CO2 22 - 32 mmol/L 25  Calcium 8.9 - 10.3 mg/dL 8.8(L)  Total Protein 6.5 - 8.1 g/dL 6.5  Total Bilirubin 0.3 - 1.2 mg/dL 0.6  Alkaline Phos 38 -  126 U/L 95  AST 15 - 41 U/L 28  ALT 17 - 63 U/L 9(L)   CBC Latest Ref Rng & Units 12/21/2016  WBC 3.8 - 10.6 K/uL 8.8  Hemoglobin 13.0 - 18.0 g/dL 11.8(L)  Hematocrit 40.0 - 52.0 % 33.5(L)  Platelets 150 - 440 K/uL 344    Dg Chest 2 View  Result Date: 12/05/2016 CLINICAL DATA:  Shortness of breath for 2-3 days.  Pulmonary mass. EXAM: CHEST  2 VIEW COMPARISON:  Chest radiograph 12/03/2017 FINDINGS: The cardiomediastinal silhouette is distorted by a known left pulmonary mass. Fullness of bilateral hilar regions. There diffusely increased interstitial markings, right more than left. There has been interval development of probable right pleural effusion. No evidence of pneumothorax. Bilateral apical pleural thickening. Stable spinal fusion across moderate lower thoracic level compression fracture. Lower cervical fusion noted. Soft tissues are grossly normal. IMPRESSION: Interval development of increased interstitial markings, which may be seen with interstitial pulmonary edema or hemorrhage. Interval development of right pleural effusion. Known left pulmonary mass. Electronically Signed   By: Fidela Salisbury M.D.   On: 12/05/2016 07:48   Ct Head W Wo Contrast  Result Date: 12/15/2016 CLINICAL DATA:  Small cell lung cancer staging EXAM: CT HEAD WITHOUT AND WITH CONTRAST TECHNIQUE: Contiguous axial images were obtained from the base of the skull through the vertex without and with intravenous contrast CONTRAST:  94mL ISOVUE-300 IOPAMIDOL (ISOVUE-300) INJECTION 61% COMPARISON:  Limited MRI head 12/05/2016, CT 06/05/2014 FINDINGS: Brain: No evidence of acute infarction, hemorrhage, hydrocephalus, extra-axial collection or mass lesion/mass effect. No enhancing mass lesion postcontrast administration. Image quality degraded by mild motion. Vascular: Negative for hyperdense vessel. Normal venous enhancement. Skull: Negative Sinuses/Orbits: Negative Other: None IMPRESSION: Negative for metastatic disease.   No acute abnormality. Electronically Signed   By: Franchot Gallo M.D.   On: 12/15/2016 13:20   Ct Chest W Contrast  Result Date: 11/23/2016 CLINICAL DATA:  Cough, loss of voice for several months, malaise EXAM: CT CHEST WITH CONTRAST TECHNIQUE: Multidetector CT imaging of the chest was performed during intravenous contrast administration. CONTRAST:  27mL ISOVUE-300 IOPAMIDOL (ISOVUE-300) INJECTION 61% COMPARISON:  Chest x-ray of 11/21/2014 FINDINGS: Cardiovascular: The heart is within normal limits in size. Diffuse coronary artery calcifications  are present. Moderate abdominal aortic atherosclerosis is noted. The mid ascending thoracic aorta measures 34 mm in diameter. Mediastinum/Nodes: There is massive mediastinal and left hilar adenopathy present. A subcarinal node measures 29 mm in diameter. A right pretracheal node measures 28 mm in diameter within anterior prevascular node of 29 mm in diameter. Left hilar nodes are difficult to measure accurately due to volume loss. Adenopathy extends in the left paratracheal, left supraclavicular region, and left lower neck as well. Lungs/Pleura: There is a large rounded mass within much of the left lower lobe posterior medially. On image 81 series 2 this mass measures 8.4 x 11.8 cm with left hilar and mediastinal extension. This is consistent with a large left lower lobe carcinoma. Multiple primarily ground-glass nodules are throughout the left lung consistent with ipsilateral metastases. No definite right lung nodule is seen. No pleural effusion is noted. This mass causes extrinsic compression and narrowing of the left main stem bronchus as well as the bronchus to the left lower lobe and lingula. Upper Abdomen: There is suspicion of a pathologically enlarged left periaortic node on the last image, 169 series 2. No hepatic lesion is seen. Small noncalcified gallstones and/or gallbladder debris layer within the gallbladder. Moderate abdominal aortic atherosclerosis is  noted. Musculoskeletal: Hardware for fusion of the lower cervical and lower thoracic - upper lumbar spine is noted no lytic or blastic bony lesion is seen with certainty. IMPRESSION: 1. Large mass occupies much of the left lower lobe with mediastinal and left hilar extension consistent with primary lung carcinoma. 2. Poorly defined ground-glass opacities within the left lung most consistent with ipsilateral lung metastases. 3. Diffuse coronary artery calcifications and changes of abdominal aortic atherosclerosis. 4. Gallbladder sludge and/or gallstones. Electronically Signed   By: Ivar Drape M.D.   On: 11/23/2016 12:13   Mr Brain Wo Contrast  Result Date: 12/05/2016 CLINICAL DATA:  Small cell lung cancer staging EXAM: MRI HEAD WITHOUT CONTRAST TECHNIQUE: Multiplanar, multiecho pulse sequences of the brain and surrounding structures were obtained without intravenous contrast. COMPARISON:  CT 06/05/2014 FINDINGS: Limited study. The patient was not able to hold still due to extreme discomfort. Limited imaging. No contrast administered Axial diffusion-weighted imaging is diagnostic. No acute infarct. No areas of restricted diffusion. Ventricle size normal.  No mass or fluid collection identified. IMPRESSION: Limited study. The patient was not able to complete the study and minimal imaging was performed. Allowing for this no significant abnormality was identified. Electronically Signed   By: Franchot Gallo M.D.   On: 12/05/2016 13:24   Ct Abdomen Pelvis W Contrast  Result Date: 12/05/2016 CLINICAL DATA:  History of lung cancer. Shortness-of-breath with nonproductive cough and hoarseness. Oxygen dependent. Heavy smoker. EXAM: CT ABDOMEN AND PELVIS WITH CONTRAST TECHNIQUE: Multidetector CT imaging of the abdomen and pelvis was performed using the standard protocol following bolus administration of intravenous contrast. CONTRAST:  176mL ISOVUE-300 IOPAMIDOL (ISOVUE-300) INJECTION 61% COMPARISON:  Chest CT  11/23/2016 FINDINGS: Lower chest: No significant change in known large left lower lobe lung mass presumed to be primary bronchogenic carcinoma. Adjacent small nodules within the left lower lobe likely metastatic disease. Patchy bilateral nodular airspace process new since the previous exam as the acuteness suggests infection and much less likely progression of patient's neoplastic disease. Small amount of bilateral pleural fluid right greater than left which is new. Calcified plaque over the left anterior descending and right coronary arteries. Hepatobiliary: 7 small hypodense liver lesions most not well seen on prior exam as the largest  is over the right lobe measuring 1.7 cm. This is likely due to metastatic disease. Minimal sludge versus cholelithiasis. Biliary tree is within normal. Pancreas: Within normal. Spleen: Within normal. Adrenals/Urinary Tract: Adrenal glands are normal. Kidneys normal in size without hydronephrosis or nephrolithiasis. Ureters and bladder are normal. Stomach/Bowel: Stomach and small bowel are unremarkable. Appendix is normal. Moderate fecal retention throughout the colon. Vascular/Lymphatic: Moderate calcified plaque over the abdominal aorta and iliac arteries. No definite adenopathy within the abdomen/pelvis. Reproductive: Within normal. Other: No free fluid or focal inflammatory change. Musculoskeletal: Moderate spondylosis of the spine with curvature of the thoracolumbar spine convex right and multilevel disc disease. Fusion hardware is present throughout the visualized thoracolumbar spine which is intact and unchanged. Multiple stable compression fractures over the visualized thoracolumbar spine. IMPRESSION: Stable partially visualized large left lower lobe mass with several small adjacent nodules likely adjacent metastatic disease. Approximately 7 small hypodense liver lesions not well seen on the prior exam with the largest 1.7 cm over the right lobe compatible with metastatic  disease. New patchy nodular airspace process over the mid to lower lungs right worse than left as the q.d. suggest infection and much less likely progression of neoplastic disease. New small bilateral pleural effusions right greater than left. Aortic Atherosclerosis (ICD10-I70.0). Atherosclerotic coronary artery disease. Minimal sludge versus cholelithiasis. Moderate spondylosis of the spine with multilevel disc disease and multiple stable compression fractures. Posterior fusion hardware intact over the thoracolumbar spine. Electronically Signed   By: Marin Olp M.D.   On: 12/05/2016 14:24   Nm Pet Image Initial (pi) Skull Base To Thigh  Result Date: 12/10/2016 CLINICAL DATA:  Initial treatment evaluation for RIGHT lower lobe mass. EXAM: NUCLEAR MEDICINE PET SKULL BASE TO THIGH TECHNIQUE: 1211 mCi F-18 FDG was injected intravenously. Full-ring PET imaging was performed from the skull base to thigh after the radiotracer. CT data was obtained and used for attenuation correction and anatomic localization. FASTING BLOOD GLUCOSE:  Value: 105 mg/dl COMPARISON:  CT 11/23/2016 FINDINGS: NECK No hypermetabolic lymph nodes in the neck. CHEST Large hypermetabolic mass in the LEFT lower lobe measuring 11 cm x 7 cm has intense peripheral metabolic activity with SUV max of 8.5. Mass extends into the LEFT hilum and mediastinum and surrounds the LEFT lower lobe bronchus extending into the subcarinal nodal station with equal metabolic activity. Large hypermetabolic prevascular lymph node measures 3.0 cm with SUV max equal 7.2. There are bilateral hypermetabolic supraclavicular nodes with a RIGHT supraclavicular node SUV max equal 5.5. There is a perispinal metastasis along the lower thoracic spine on the LEFT SUV max equal 6.3 measuring 20 mm on image 130, series 4. This at the T12 vertebral body level. A high LEFT axillary lymph node l metastasis also noted. ABDOMEN/PELVIS Focal activity at the junction of the IVC and  central liver with SUV max equal 4.5. Difficult to define liver lesion vs metastatic lymph node. Favor node. Adrenal glands appear normal. Kidneys are normal. For no hypermetabolic abdominopelvic lymph nodes. SKELETON Multiple foci of skeletal metastasis example lesion in the LEFT sacrum SUV max equal 4.3. Lesions within the RIGHT femoral head and femoral diaphysis. Several rib lesions are present. LEFT femur lesion present with SUV max equal 3.6 . IMPRESSION: 1. Large hypermetabolic mass in the LEFT lower lobe measuring up to 11 cm consists with bronchogenic carcinoma. 2. Direct extension of mass into the mediastinum the surrounding the LEFT lobe bronchus. 3. Extensive mediastinal hilar prevascular and bilateral supraclavicular metastatic adenopathy. Metastatic adenopathy also in  the level 5 node extension in LEFT axilla. 4. 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 5. Stage IV bronchogenic carcinoma. Electronically Signed   By: Suzy Bouchard M.D.   On: 12/10/2016 16:38   Dg Chest Portable 1 View  Result Date: 12/03/2016 CLINICAL DATA:  Worsening shortness of breath over the past 6 weeks in patient with a left lower lobe mass. EXAM: PORTABLE CHEST 1 VIEW COMPARISON:  CT chest 11/23/2016. Single-view of the chest with 11/21/2014. FINDINGS: Extensive perihilar airspace disease is present bilaterally. Bulky mediastinal lymphadenopathy is identified in the left lower lobe mass is noted but better visualized on prior CT. Heart size is normal. No pneumothorax or pleural effusion. IMPRESSION: Extensive perihilar airspace disease could be due to pneumonia or edema. Bulky mediastinal lymphadenopathy and left lower lobe mass as seen on prior CT. Electronically Signed   By: Inge Rise M.D.   On: 12/03/2016 09:50   [place] Core Biopsy (lymph Nodes)  Result Date: 12/06/2016 INDICATION: Known left lower lobe lung mass with supraclavicular lymphadenopathy. EXAM:  ULTRASOUND-GUIDED LEFT SUPRACLAVICULAR LYMPH NODE BIOPSY. MEDICATIONS: None. ANESTHESIA/SEDATION: None FLUOROSCOPY TIME:  Not applicable COMPLICATIONS: None immediate. PROCEDURE: Informed written consent was obtained from the patient after a thorough discussion of the procedural risks, benefits and alternatives. All questions were addressed. Maximal Sterile Barrier Technique was utilized including caps, mask, sterile gowns, sterile gloves, sterile drape, hand hygiene and skin antiseptic. A timeout was performed prior to the initiation of the procedure. Utilizing real-time ultrasound guidance and 1% xylocaine as local anesthetic a 17 gauge guiding needle was placed percutaneously into 1 of the left supraclavicular lymph nodes. Multiple 18 gauge core biopsies were then obtained and sent for pathologic evaluation. Dedicated ultrasound imaging was obtained during the needle passes. Guiding needle was then removed and hemostasis obtained at the puncture site. Patient tolerated the procedure well was returned his room in satisfactory condition. IMPRESSION: Successful ultrasound-guided left supraclavicular lymph node biopsy Electronically Signed   By: Inez Catalina M.D.   On: 12/06/2016 15:47   PET scan 12/10/2016  1. Large hypermetabolic mass in the LEFT lower lobe measuring up to 11 cm consists with bronchogenic carcinoma. 2. Direct extension of mass into the mediastinum the surrounding the LEFT lobe bronchus. 3. Extensive mediastinal hilar prevascular and bilateral supraclavicular metastatic adenopathy. Metastatic adenopathy also in the level 5 node extension in LEFT axilla. 4. 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 5. Stage IV bronchogenic carcinoma.   # CT negative for metastasis.   Assessment and plan Patient is a 56 y.o. male present to follow up of recenlty diagnosed small cell lung cancer.  Cancer Staging Small cell lung cancer [facility]) Staging form: Lung,  AJCC 8th Edition - Clinical stage from 12/10/2016: Stage IV (cT4, cN3, cM1c) - Signed by Earlie Server, MD on 12/10/2016  1. Small cell lung cancer ([place])   2. Tobacco abuse counseling   3. Encounter for antineoplastic chemotherapy   4. Hyponatremia   5. 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 Bone metastasis ([facility])    #Extensive small cell lung cancer, plan carboplatin and Etoposide Q21d x 4 with Onpro, starting today.  Antiemetics-Zofran and Compazine; EMLA cream sent to pharmacy.  # Chronic Hyponatremia, slightly lower than his ETOH consumption, and can also be due to SIADH from small cell lung cancer, asymptomatic.  Start trials of oral salt tablets 1g BID. Repeat BMP in 2 days.    # Smoking  cessation was discussed with patient and cessation program information was given to patient.  # ETOH cessation discussed with patient.  # patient will see Dr.Chrystal today as well for palliative radiation to his lung and other symptomatic mets.  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 plan Zometa monthly starting next week.    Follow up on 1 week for evaluation of toxicity after cycle 1 Carbo/Etoposide/onpro Total face to face encounter time for this patient visit was 40 min. >50% of the time was  spent in counseling and coordination of care.  Earlie Server, MD, PhD Hematology Oncology [facility] Pager- [phone] 12/21/2016

## 2016-12-21 NOTE — Progress Notes (Signed)
  Oncology Nurse Navigator Documentation  Navigator Location: [facility]Med Onc (12/21/16 1000)   )Navigator Encounter Type: Treatment (12/21/16 1000)                   Treatment Initiated Date: 12/21/16 (12/21/16 1000) Patient Visit Type: MedOnc (12/21/16 1000) Treatment Phase: First Chemo Tx (12/21/16 1000) Barriers/Navigation Needs: Coordination of Care (12/21/16 1000)   Interventions: Coordination of Care (12/21/16 1000)   Coordination of Care: Appts (12/21/16 1000)         met with patient prior to first chemo treatment today. All questions answered at the time of visit. Reviewed upcoming appts with pt. Pt verbalized understanding. No further questions. Instructed pt to call with any further questions or needs.          Time Spent with Patient: 60 (12/21/16 1000)

## 2016-12-21 NOTE — Progress Notes (Signed)
Patient here today for follow up and new start of chemotherapy

## 2016-12-22 ENCOUNTER — Inpatient Hospital Stay: Payer: Medicare Other

## 2016-12-22 ENCOUNTER — Telehealth: Payer: Self-pay | Admitting: *Deleted

## 2016-12-22 VITALS — BP 126/71 | HR 80 | Temp 98.1°F | Resp 18

## 2016-12-22 DIAGNOSIS — C3490 Malignant neoplasm of unspecified part of unspecified bronchus or lung: Secondary | ICD-10-CM

## 2016-12-22 DIAGNOSIS — Z5111 Encounter for antineoplastic chemotherapy: Secondary | ICD-10-CM | POA: Diagnosis not present

## 2016-12-22 MED ORDER — ETOPOSIDE CHEMO INJECTION 1 GM/50ML
100.0000 mg/m2 | Freq: Once | INTRAVENOUS | Status: AC
  Administered 2016-12-22: 160 mg via INTRAVENOUS
  Filled 2016-12-22: qty 8

## 2016-12-22 MED ORDER — DEXAMETHASONE SODIUM PHOSPHATE 100 MG/10ML IJ SOLN: 10.0000 mg | Freq: Once | INTRAMUSCULAR | Status: DC

## 2016-12-22 MED ORDER — HEPARIN SOD (PORK) LOCK FLUSH 100 UNIT/ML IV SOLN
500.0000 [IU] | Freq: Once | INTRAVENOUS | Status: AC
Start: 2016-12-22 — End: 2016-12-22
  Administered 2016-12-22: 500 [IU] via INTRAVENOUS

## 2016-12-22 MED ORDER — DEXAMETHASONE SODIUM PHOSPHATE 10 MG/ML IJ SOLN
10.0000 mg | Freq: Once | INTRAMUSCULAR | Status: AC
  Administered 2016-12-22: 10 mg via INTRAVENOUS
  Filled 2016-12-22: qty 1

## 2016-12-22 MED ORDER — SODIUM CHLORIDE 0.9% FLUSH
10.0000 mL | INTRAVENOUS | Status: DC | PRN
  Administered 2016-12-22: 10 mL via INTRAVENOUS
  Filled 2016-12-22: qty 10

## 2016-12-22 MED ORDER — SODIUM CHLORIDE 0.9 % IV SOLN
Freq: Once | INTRAVENOUS | Status: AC
  Administered 2016-12-22: 14:00:00 via INTRAVENOUS
  Filled 2016-12-22: qty 1000

## 2016-12-22 NOTE — Telephone Encounter (Signed)
Faxed over an order for Palliative Care Services, Please sign and fax back ASAP

## 2016-12-22 NOTE — Telephone Encounter (Signed)
This was faxed to their office on 12/13/16.  I will refax

## 2016-12-23 ENCOUNTER — Inpatient Hospital Stay: Payer: Medicare Other | Admitting: *Deleted

## 2016-12-23 ENCOUNTER — Inpatient Hospital Stay: Payer: Medicare Other

## 2016-12-23 VITALS — BP 153/94 | HR 98 | Temp 97.8°F | Resp 20

## 2016-12-23 DIAGNOSIS — E871 Hypo-osmolality and hyponatremia: Secondary | ICD-10-CM

## 2016-12-23 DIAGNOSIS — C3490 Malignant neoplasm of unspecified part of unspecified bronchus or lung: Secondary | ICD-10-CM

## 2016-12-23 DIAGNOSIS — Z5111 Encounter for antineoplastic chemotherapy: Secondary | ICD-10-CM | POA: Diagnosis not present

## 2016-12-23 LAB — BASIC METABOLIC PANEL
ANION GAP: 9 (ref 5–15)
BUN: 10 mg/dL (ref 6–20)
CALCIUM: 8.9 mg/dL (ref 8.9–10.3)
CO2: 27 mmol/L (ref 22–32)
Chloride: 89 mmol/L — ABNORMAL LOW (ref 101–111)
Creatinine, Ser: 0.33 mg/dL — ABNORMAL LOW (ref 0.61–1.24)
Glucose, Bld: 96 mg/dL (ref 65–99)
POTASSIUM: 3.7 mmol/L (ref 3.5–5.1)
SODIUM: 125 mmol/L — AB (ref 135–145)

## 2016-12-23 MED ORDER — PEGFILGRASTIM 6 MG/0.6ML ~~LOC~~ PSKT
6.0000 mg | PREFILLED_SYRINGE | Freq: Once | SUBCUTANEOUS | Status: AC
  Administered 2016-12-23: 6 mg via SUBCUTANEOUS
  Filled 2016-12-23: qty 0.6

## 2016-12-23 MED ORDER — SODIUM CHLORIDE 0.9% FLUSH
10.0000 mL | INTRAVENOUS | Status: DC | PRN
  Administered 2016-12-23: 10 mL
  Filled 2016-12-23: qty 10

## 2016-12-23 MED ORDER — SODIUM CHLORIDE 0.9 % IV SOLN
100.0000 mg/m2 | Freq: Once | INTRAVENOUS | Status: AC
  Administered 2016-12-23: 160 mg via INTRAVENOUS
  Filled 2016-12-23: qty 8

## 2016-12-23 MED ORDER — DEXAMETHASONE SODIUM PHOSPHATE 10 MG/ML IJ SOLN
10.0000 mg | Freq: Once | INTRAMUSCULAR | Status: AC
  Administered 2016-12-23: 10 mg via INTRAVENOUS
  Filled 2016-12-23: qty 1

## 2016-12-23 MED ORDER — SODIUM CHLORIDE 0.9 % IV SOLN
Freq: Once | INTRAVENOUS | Status: AC
  Administered 2016-12-23: 14:00:00 via INTRAVENOUS
  Filled 2016-12-23: qty 1000

## 2016-12-23 MED ORDER — HEPARIN SOD (PORK) LOCK FLUSH 100 UNIT/ML IV SOLN
500.0000 [IU] | Freq: Once | INTRAVENOUS | Status: AC | PRN
  Administered 2016-12-23: 500 [IU]
  Filled 2016-12-23: qty 5

## 2016-12-23 NOTE — H&P (Signed)
[facility] Admission History & Physical  MRN : [id number]  Steven Davis is a 56 y.o. (May 18, 1960) male who presents with chief complaint of No chief complaint on file. Marland Kitchen  History of Present Illness: Patient presents today for placement of a Port-A-Cath.  He has been recently diagnosed with lung cancer.  He is to begin chemotherapy and needs a good venous access for lab draws, CT scans, and his chemotherapy infusions.  He has severe pain in his back and his ribs.  He has no fever or chills.  No current facility-administered medications for this encounter.    Current Outpatient Medications  Medication Sig Dispense Refill  . albuterol (PROVENTIL HFA;VENTOLIN HFA) 108 (90 BASE) MCG/ACT inhaler Inhale 2 puffs into the lungs every 6 (six) hours as needed for wheezing or shortness of breath.    Marland Kitchen amLODipine (NORVASC) 10 MG tablet Take 10 mg by mouth daily.    Marland Kitchen docusate sodium (COLACE) 100 MG capsule Take 1 capsule (100 mg total) 2 (two) times daily as needed by mouth for mild constipation. 10 capsule 0  . feeding supplement, ENSURE ENLIVE, (ENSURE ENLIVE) LIQD Take 237 mLs 2 (two) times daily between meals by mouth. 237 mL 12  . ferrous sulfate 325 (65 FE) MG tablet Take 325 mg daily by mouth.    . Fluticasone-Salmeterol (ADVAIR DISKUS) 250-50 MCG/DOSE AEPB Inhale 1 puff 2 (two) times daily into the lungs. 60 each 1  . lisinopril (PRINIVIL,ZESTRIL) 40 MG tablet Take 40 mg by mouth daily.    . nicotine (NICODERM CQ - DOSED IN MG/24 HOURS) 21 mg/24hr patch Place 1 patch (21 mg total) daily onto the skin. 28 patch 0  . omeprazole (PRILOSEC) 40 MG capsule Take 40 mg by mouth daily.    Marland Kitchen lidocaine-prilocaine (EMLA) cream Apply to affected area once 30 g 3  . ondansetron (ZOFRAN) 8 MG tablet Take 1 tablet (8 mg total) by mouth 2 (two) times daily as needed for refractory nausea / vomiting. Start on day 3 after carboplatin chemo. 30 tablet 1  . Oxycodone HCl 10 MG TABS Take 1  tablet (10 mg total) by mouth every 6 (six) hours as needed. 120 tablet 0  . prochlorperazine (COMPAZINE) 10 MG tablet Take 1 tablet (10 mg total) by mouth every 6 (six) hours as needed (Nausea or vomiting). 30 tablet 1  . sodium chloride 1 g tablet Take 1 tablet (1 g total) by mouth 2 (two) times daily with a meal. (Patient not taking: Reported on 12/21/2016) 60 tablet 0  . tadalafil (CIALIS) 10 MG tablet Take 10 mg by mouth daily as needed for erectile dysfunction.     Facility-Administered Medications Ordered in Other Encounters  Medication Dose Route Frequency Provider Last Rate Last Dose  . etoposide (VEPESID) 160 mg in sodium chloride 0.9 % 500 mL chemo infusion  100 mg/m2 (Treatment Plan Recorded) Intravenous Once Earlie Server, MD      . heparin lock flush 100 unit/mL  500 Units Intracatheter Once PRN Earlie Server, MD      . pegfilgrastim (NEULASTA ONPRO KIT) injection 6 mg  6 mg Subcutaneous Once Earlie Server, MD      . sodium chloride flush (NS) 0.9 % injection 10 mL  10 mL Intracatheter PRN Earlie Server, MD        Past Medical History:  Diagnosis Date  . Anemia   . Cancer ([place])   . Erectile dysfunction   . GERD (gastroesophageal reflux disease)   .  Hypertension     Past Surgical History:  Procedure Laterality Date  . BACK SURGERY    . ESOPHAGOGASTRODUODENOSCOPY (EGD) WITH PROPOFOL Left 11/22/2014   Procedure: ESOPHAGOGASTRODUODENOSCOPY (EGD) WITH PROPOFOL;  Surgeon: Manya Silvas, MD;  Location: [facility] ENDOSCOPY;  Service: Endoscopy;  Laterality: Left;  . NECK SURGERY    . PORTA CATH INSERTION N/A 12/20/2016   Procedure: PORTA CATH INSERTION;  Surgeon: Algernon Huxley, MD;  Location: [facility];  Service: Cardiovascular;  Laterality: N/A;    Social History Social History   Tobacco Use  . Smoking status: Current Every Day Smoker    Packs/day: 1.00    Types: Cigarettes  . Smokeless tobacco: Never Used  Substance Use Topics  . Alcohol use: Yes    Alcohol/week: 1.2 - 1.8 oz     Types: 2 - 3 Cans of beer per week  . Drug use: No    Family History Family History  Problem Relation Age of Onset  . Hypertension Father   no bleeding disorders, clotting disorders, or aneurysms  Allergies  Allergen Reactions  . Aspirin Other (See Comments)    History of GI bleed  . Benadryl [Diphenhydramine Hcl (Sleep)] Nausea And Vomiting     REVIEW OF SYSTEMS (Negative unless checked)  Constitutional: '[]'$ Weight loss  '[]'$ Fever  '[]'$ Chills Cardiac: '[]'$ Chest pain   '[]'$ Chest pressure   '[x]'$ Palpitations   '[]'$ Shortness of breath when laying flat   '[]'$ Shortness of breath at rest   '[x]'$ Shortness of breath with exertion. Vascular:  '[]'$ Pain in legs with walking   '[]'$ Pain in legs at rest   '[]'$ Pain in legs when laying flat   '[]'$ Claudication   '[]'$ Pain in feet when walking  '[]'$ Pain in feet at rest  '[]'$ Pain in feet when laying flat   '[]'$ History of DVT   '[]'$ Phlebitis   '[]'$ Swelling in legs   '[]'$ Varicose veins   '[]'$ Non-healing ulcers Pulmonary:   '[]'$ Uses home oxygen   '[]'$ Productive cough   '[]'$ Hemoptysis   '[]'$ Wheeze  '[]'$ COPD   '[]'$ Asthma Neurologic:  '[]'$ Dizziness  '[]'$ Blackouts   '[]'$ Seizures   '[]'$ History of stroke   '[]'$ History of TIA  '[]'$ Aphasia   '[]'$ Temporary blindness   '[]'$ Dysphagia   '[]'$ Weakness or numbness in arms   '[]'$ Weakness or numbness in legs Musculoskeletal:  '[x]'$ Arthritis   '[]'$ Joint swelling   '[x]'$ Joint pain   '[x]'$ Low back pain Hematologic:  '[]'$ Easy bruising  '[]'$ Easy bleeding   '[]'$ Hypercoagulable state   '[x]'$ Anemic  '[]'$ Hepatitis Gastrointestinal:  '[]'$ Blood in stool   '[]'$ Vomiting blood  '[x]'$ Gastroesophageal reflux/heartburn   '[]'$ Difficulty swallowing. Genitourinary:  '[]'$ Chronic kidney disease   '[]'$ Difficult urination  '[]'$ Frequent urination  '[]'$ Burning with urination   '[]'$ Blood in urine Skin:  '[]'$ Rashes   '[]'$ Ulcers   '[]'$ Wounds Psychological:  '[]'$ History of anxiety   '[]'$  History of major depression.  Physical Examination  Vitals:   12/20/16 0824 12/20/16 1015 12/20/16 1030 12/20/16 1045  BP: (!) 148/97 (!) 147/78 130/83 (!) 143/67  Pulse: 89 98  (!) 105 (!) 106  Resp: (!) 23 (!) 21 20 (!) 21  Temp: 98.6 F (37 C)     TempSrc: Oral     SpO2: 93% 93% 91% 94%  Weight: 58.5 kg (129 lb)     Height: '5\' 3"'$  (1.6 m)      Body mass index is 22.85 kg/m. Gen: WD/WN, NAD. Thin disheveled man who appears older than stated age. Head: [place]/AT, No temporalis wasting.  Ear/Nose/Throat: Hearing grossly intact, nares w/o erythema or drainage, oropharynx w/o Erythema/Exudate,  Eyes: Conjunctiva clear,  sclera non-icteric Neck: Trachea midline.  No JVD.  Pulmonary:  Good air movement, respirations not labored, no use of accessory muscles.  Cardiac: RRR, no JVD Vascular:  Vessel Right Left  Radial Palpable Palpable                                    Musculoskeletal: M/S 5/5 throughout.  Extremities without ischemic changes.  No deformity or atrophy.  Neurologic: Sensation grossly intact in extremities.  Symmetrical.  Speech is fluent. Motor exam as listed above. Psychiatric: Judgment intact, Mood & affect appropriate for pt's clinical situation. Dermatologic: No rashes or ulcers noted.  No cellulitis or open wounds.      CBC Lab Results  Component Value Date   WBC 8.8 12/21/2016   HGB 11.8 (L) 12/21/2016   HCT 33.5 (L) 12/21/2016   MCV 93.2 12/21/2016   PLT 344 12/21/2016    BMET    Component Value Date/Time   NA 125 (L) 12/23/2016 1257   K 3.7 12/23/2016 1257   CL 89 (L) 12/23/2016 1257   CO2 27 12/23/2016 1257   GLUCOSE 96 12/23/2016 1257   BUN 10 12/23/2016 1257   CREATININE 0.33 (L) 12/23/2016 1257   CALCIUM 8.9 12/23/2016 1257   GFRNONAA >60 12/23/2016 1257   GFRAA >60 12/23/2016 1257   Estimated Creatinine Clearance: 82 mL/min (A) (by C-G formula based on SCr of 0.33 mg/dL (L)).  COAG Lab Results  Component Value Date   INR 1.01 12/06/2016   INR 1.34 11/22/2014   INR 1.32 11/21/2014    Radiology Dg Chest 2 View  Result Date: 12/05/2016 CLINICAL DATA:  Shortness of breath for 2-3 days.  Pulmonary  mass. EXAM: CHEST  2 VIEW COMPARISON:  Chest radiograph 12/03/2017 FINDINGS: The cardiomediastinal silhouette is distorted by a known left pulmonary mass. Fullness of bilateral hilar regions. There diffusely increased interstitial markings, right more than left. There has been interval development of probable right pleural effusion. No evidence of pneumothorax. Bilateral apical pleural thickening. Stable spinal fusion across moderate lower thoracic level compression fracture. Lower cervical fusion noted. Soft tissues are grossly normal. IMPRESSION: Interval development of increased interstitial markings, which may be seen with interstitial pulmonary edema or hemorrhage. Interval development of right pleural effusion. Known left pulmonary mass. Electronically Signed   By: Fidela Salisbury M.D.   On: 12/05/2016 07:48   Ct Head W Wo Contrast  Result Date: 12/15/2016 CLINICAL DATA:  Small cell lung cancer staging EXAM: CT HEAD WITHOUT AND WITH CONTRAST TECHNIQUE: Contiguous axial images were obtained from the base of the skull through the vertex without and with intravenous contrast CONTRAST:  26m ISOVUE-300 IOPAMIDOL (ISOVUE-300) INJECTION 61% COMPARISON:  Limited MRI head 12/05/2016, CT 06/05/2014 FINDINGS: Brain: No evidence of acute infarction, hemorrhage, hydrocephalus, extra-axial collection or mass lesion/mass effect. No enhancing mass lesion postcontrast administration. Image quality degraded by mild motion. Vascular: Negative for hyperdense vessel. Normal venous enhancement. Skull: Negative Sinuses/Orbits: Negative Other: None IMPRESSION: Negative for metastatic disease.  No acute abnormality. Electronically Signed   By: CFranchot GalloM.D.   On: 12/15/2016 13:20   Mr Brain Wo Contrast  Result Date: 12/05/2016 CLINICAL DATA:  Small cell lung cancer staging EXAM: MRI HEAD WITHOUT CONTRAST TECHNIQUE: Multiplanar, multiecho pulse sequences of the brain and surrounding structures were obtained without  intravenous contrast. COMPARISON:  CT 06/05/2014 FINDINGS: Limited study. The patient was not able to hold still due  to extreme discomfort. Limited imaging. No contrast administered Axial diffusion-weighted imaging is diagnostic. No acute infarct. No areas of restricted diffusion. Ventricle size normal.  No mass or fluid collection identified. IMPRESSION: Limited study. The patient was not able to complete the study and minimal imaging was performed. Allowing for this no significant abnormality was identified. Electronically Signed   By: Franchot Gallo M.D.   On: 12/05/2016 13:24   Ct Abdomen Pelvis W Contrast  Result Date: 12/05/2016 CLINICAL DATA:  History of lung cancer. Shortness-of-breath with nonproductive cough and hoarseness. Oxygen dependent. Heavy smoker. EXAM: CT ABDOMEN AND PELVIS WITH CONTRAST TECHNIQUE: Multidetector CT imaging of the abdomen and pelvis was performed using the standard protocol following bolus administration of intravenous contrast. CONTRAST:  117m ISOVUE-300 IOPAMIDOL (ISOVUE-300) INJECTION 61% COMPARISON:  Chest CT 11/23/2016 FINDINGS: Lower chest: No significant change in known large left lower lobe lung mass presumed to be primary bronchogenic carcinoma. Adjacent small nodules within the left lower lobe likely metastatic disease. Patchy bilateral nodular airspace process new since the previous exam as the acuteness suggests infection and much less likely progression of patient's neoplastic disease. Small amount of bilateral pleural fluid right greater than left which is new. Calcified plaque over the left anterior descending and right coronary arteries. Hepatobiliary: 7 small hypodense liver lesions most not well seen on prior exam as the largest is over the right lobe measuring 1.7 cm. This is likely due to metastatic disease. Minimal sludge versus cholelithiasis. Biliary tree is within normal. Pancreas: Within normal. Spleen: Within normal. Adrenals/Urinary Tract: Adrenal  glands are normal. Kidneys normal in size without hydronephrosis or nephrolithiasis. Ureters and bladder are normal. Stomach/Bowel: Stomach and small bowel are unremarkable. Appendix is normal. Moderate fecal retention throughout the colon. Vascular/Lymphatic: Moderate calcified plaque over the abdominal aorta and iliac arteries. No definite adenopathy within the abdomen/pelvis. Reproductive: Within normal. Other: No free fluid or focal inflammatory change. Musculoskeletal: Moderate spondylosis of the spine with curvature of the thoracolumbar spine convex right and multilevel disc disease. Fusion hardware is present throughout the visualized thoracolumbar spine which is intact and unchanged. Multiple stable compression fractures over the visualized thoracolumbar spine. IMPRESSION: Stable partially visualized large left lower lobe mass with several small adjacent nodules likely adjacent metastatic disease. Approximately 7 small hypodense liver lesions not well seen on the prior exam with the largest 1.7 cm over the right lobe compatible with metastatic disease. New patchy nodular airspace process over the mid to lower lungs right worse than left as the q.d. suggest infection and much less likely progression of neoplastic disease. New small bilateral pleural effusions right greater than left. Aortic Atherosclerosis (ICD10-I70.0). Atherosclerotic coronary artery disease. Minimal sludge versus cholelithiasis. Moderate spondylosis of the spine with multilevel disc disease and multiple stable compression fractures. Posterior fusion hardware intact over the thoracolumbar spine. Electronically Signed   By: DMarin OlpM.D.   On: 12/05/2016 14:24   Nm Pet Image Initial (pi) Skull Base To Thigh  Result Date: 12/10/2016 CLINICAL DATA:  Initial treatment evaluation for RIGHT lower lobe mass. EXAM: NUCLEAR MEDICINE PET SKULL BASE TO THIGH TECHNIQUE: 1211 mCi F-18 FDG was injected intravenously. Full-ring PET imaging was  performed from the skull base to thigh after the radiotracer. CT data was obtained and used for attenuation correction and anatomic localization. FASTING BLOOD GLUCOSE:  Value: 105 mg/dl COMPARISON:  CT 11/23/2016 FINDINGS: NECK No hypermetabolic lymph nodes in the neck. CHEST Large hypermetabolic mass in the LEFT lower lobe measuring 11 cm x 7  cm has intense peripheral metabolic activity with SUV max of 8.5. Mass extends into the LEFT hilum and mediastinum and surrounds the LEFT lower lobe bronchus extending into the subcarinal nodal station with equal metabolic activity. Large hypermetabolic prevascular lymph node measures 3.0 cm with SUV max equal 7.2. There are bilateral hypermetabolic supraclavicular nodes with a RIGHT supraclavicular node SUV max equal 5.5. There is a perispinal metastasis along the lower thoracic spine on the LEFT SUV max equal 6.3 measuring 20 mm on image 130, series 4. This at the T12 vertebral body level. A high LEFT axillary lymph node l metastasis also noted. ABDOMEN/PELVIS Focal activity at the junction of the IVC and central liver with SUV max equal 4.5. Difficult to define liver lesion vs metastatic lymph node. Favor node. Adrenal glands appear normal. Kidneys are normal. For no hypermetabolic abdominopelvic lymph nodes. SKELETON Multiple foci of skeletal metastasis example lesion in the LEFT sacrum SUV max equal 4.3. Lesions within the RIGHT femoral head and femoral diaphysis. Several rib lesions are present. LEFT femur lesion present with SUV max equal 3.6 . IMPRESSION: 1. Large hypermetabolic mass in the LEFT lower lobe measuring up to 11 cm consists with bronchogenic carcinoma. 2. Direct extension of mass into the mediastinum the surrounding the LEFT lobe bronchus. 3. Extensive mediastinal hilar prevascular and bilateral supraclavicular metastatic adenopathy. Metastatic adenopathy also in the level 5 node extension in LEFT axilla. 4. 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 5. Stage IV bronchogenic carcinoma. Electronically Signed   By: Suzy Bouchard M.D.   On: 12/10/2016 16:38   Dg Chest Portable 1 View  Result Date: 12/03/2016 CLINICAL DATA:  Worsening shortness of breath over the past 6 weeks in patient with a left lower lobe mass. EXAM: PORTABLE CHEST 1 VIEW COMPARISON:  CT chest 11/23/2016. Single-view of the chest with 11/21/2014. FINDINGS: Extensive perihilar airspace disease is present bilaterally. Bulky mediastinal lymphadenopathy is identified in the left lower lobe mass is noted but better visualized on prior CT. Heart size is normal. No pneumothorax or pleural effusion. IMPRESSION: Extensive perihilar airspace disease could be due to pneumonia or edema. Bulky mediastinal lymphadenopathy and left lower lobe mass as seen on prior CT. Electronically Signed   By: Inge Rise M.D.   On: 12/03/2016 09:50   [place] Core Biopsy (lymph Nodes)  Result Date: 12/06/2016 INDICATION: Known left lower lobe lung mass with supraclavicular lymphadenopathy. EXAM: ULTRASOUND-GUIDED LEFT SUPRACLAVICULAR LYMPH NODE BIOPSY. MEDICATIONS: None. ANESTHESIA/SEDATION: None FLUOROSCOPY TIME:  Not applicable COMPLICATIONS: None immediate. PROCEDURE: Informed written consent was obtained from the patient after a thorough discussion of the procedural risks, benefits and alternatives. All questions were addressed. Maximal Sterile Barrier Technique was utilized including caps, mask, sterile gowns, sterile gloves, sterile drape, hand hygiene and skin antiseptic. A timeout was performed prior to the initiation of the procedure. Utilizing real-time ultrasound guidance and 1% xylocaine as local anesthetic a 17 gauge guiding needle was placed percutaneously into 1 of the left supraclavicular lymph nodes. Multiple 18 gauge core biopsies were then obtained and sent for pathologic evaluation. Dedicated ultrasound imaging was obtained during the needle passes.  Guiding needle was then removed and hemostasis obtained at the puncture site. Patient tolerated the procedure well was returned his room in satisfactory condition. IMPRESSION: Successful ultrasound-guided left supraclavicular lymph node biopsy Electronically Signed   By: Inez Catalina M.D.   On: 12/06/2016 15:47      Assessment/Plan 1.  Lung cancer.  For Port-A-Cath today.  Risks and benefits discussed 2.  HTN. Stable on outpatient medications and blood pressure control important in reducing the progression of atherosclerotic disease. On appropriate oral medications.    Leotis Pain, MD  12/23/2016 2:11 PM

## 2016-12-23 NOTE — Progress Notes (Signed)
Labs drawn today as ordered.  Na+ noted to be 125 today.  BP 153/94.  No intervention per Almyra Free per Dr. Tasia Catchings.

## 2016-12-27 ENCOUNTER — Other Ambulatory Visit: Payer: Self-pay | Admitting: Oncology

## 2016-12-27 DIAGNOSIS — C3490 Malignant neoplasm of unspecified part of unspecified bronchus or lung: Secondary | ICD-10-CM

## 2016-12-27 NOTE — Progress Notes (Signed)
Hematology/Oncology Follow Up Note [facility]  Telephone:([phone] [phone]  Patient Care Team: Valera Castle, MD as PCP - General (Family Medicine) Telford Nab, RN as Registered Nurse   Name of the patient: Steven Davis  [id number]  56   REASON FOR VISIT Following up for management of small cell lung cancer.  HISTORY OF PRESENT ILLNESS Patient was seen and examined the bedside. He reports improvement of his breathing. He had a good night's sleep. Continue to have intermittent cough. He had CT and brain MRI done. He couldn't tolerate brain MRI.CT abdomen and pelvis CT abdomen and pelvis showed multiple hypodense liver lesions. He is also scheduled to have ultrasound-guided biopsy of his left supraclavicular lymph node.He tells me that he usually lies on his right side because of chronic back problem. He feels more comfortable breathing today  INTERVAL HISTORY Patient presents to follow up for evaluation of the toxicities after cycle 1 chemotherapy. Patient reports feeling tired. Denies any fever or chills. Breathing is at baseline. He uses home oxygen. He continues to have left-sided back pain. He also reports to me that his spine surgery hardware is sticking out. Continued to have intermittent coughing, denies any hemoptysis.  Current pain medication : Offered him long-acting narcotics in combination with short acting. Patient declines. He is currently on oxycodone 10 mg every 6 hours as needed. He reports usually taking 3-4 oxycodone per day. His pain levels were 10 which relieved to about 6-7 with pain medication.    Review of Systems  Constitutional: Positive for malaise/fatigue. Negative for chills, fever and weight loss.  HENT: Negative for ear discharge and hearing loss.   Eyes: Negative for blurred vision and double vision.  Respiratory: Positive for cough and shortness of breath. Negative for hemoptysis and sputum  production.   Cardiovascular: Negative for chest pain, palpitations and leg swelling.  Gastrointestinal: Negative for abdominal pain, heartburn, nausea and vomiting.  Genitourinary: Negative for dysuria and urgency.  Musculoskeletal: Positive for back pain. Negative for myalgias.       Left side rib pain.  Wound on back.   Skin: Negative for itching and rash.  Neurological: Negative for dizziness and headaches.  Endo/Heme/Allergies: Negative for environmental allergies. Does not bruise/bleed easily.  Psychiatric/Behavioral: Negative for depression and suicidal ideas.     Allergies  Allergen Reactions  . Aspirin Other (See Comments)    History of GI bleed  . Benadryl [Diphenhydramine Hcl (Sleep)] Nausea And Vomiting     Past Medical History:  Diagnosis Date  . Anemia   . Cancer ([place])   . Erectile dysfunction   . GERD (gastroesophageal reflux disease)   . Hypertension      Past Surgical History:  Procedure Laterality Date  . BACK SURGERY    . ESOPHAGOGASTRODUODENOSCOPY (EGD) WITH PROPOFOL Left 11/22/2014   Procedure: ESOPHAGOGASTRODUODENOSCOPY (EGD) WITH PROPOFOL;  Surgeon: Manya Silvas, MD;  Location: [facility] ENDOSCOPY;  Service: Endoscopy;  Laterality: Left;  . NECK SURGERY    . PORTA CATH INSERTION N/A 12/20/2016   Procedure: PORTA CATH INSERTION;  Surgeon: Algernon Huxley, MD;  Location: [place] CV LAB;  Service: Cardiovascular;  Laterality: N/A;    Social History   Socioeconomic History  . Marital status: Single    Spouse name: Not on file  . Number of children: Not on file  . Years of education: Not on file  . Highest education level: Not on file  Social Needs  . Emergency planning/management officer  strain: Not on file  . Food insecurity - worry: Not on file  . Food insecurity - inability: Not on file  . Transportation needs - medical: Not on file  . Transportation needs - non-medical: Not on file  Occupational History  . Not on file  Tobacco Use  . Smoking status:  Current Every Day Smoker    Packs/day: 1.00    Types: Cigarettes  . Smokeless tobacco: Never Used  Substance and Sexual Activity  . Alcohol use: Yes    Alcohol/week: 1.2 - 1.8 oz    Types: 2 - 3 Cans of beer per week  . Drug use: No  . Sexual activity: Not on file  Other Topics Concern  . Not on file  Social History Narrative  . Not on file    Family History  Problem Relation Age of Onset  . Hypertension Father      Current Outpatient Medications:  .  albuterol (PROVENTIL HFA;VENTOLIN HFA) 108 (90 BASE) MCG/ACT inhaler, Inhale 2 puffs into the lungs every 6 (six) hours as needed for wheezing or shortness of breath., Disp: , Rfl:  .  amLODipine (NORVASC) 10 MG tablet, Take 10 mg by mouth daily., Disp: , Rfl:  .  docusate sodium (COLACE) 100 MG capsule, Take 1 capsule (100 mg total) 2 (two) times daily as needed by mouth for mild constipation., Disp: 10 capsule, Rfl: 0 .  feeding supplement, ENSURE ENLIVE, (ENSURE ENLIVE) LIQD, Take 237 mLs 2 (two) times daily between meals by mouth., Disp: 237 mL, Rfl: 12 .  ferrous sulfate 325 (65 FE) MG tablet, Take 325 mg daily by mouth., Disp: , Rfl:  .  Fluticasone-Salmeterol (ADVAIR DISKUS) 250-50 MCG/DOSE AEPB, Inhale 1 puff 2 (two) times daily into the lungs., Disp: 60 each, Rfl: 1 .  lidocaine-prilocaine (EMLA) cream, Apply to affected area once, Disp: 30 g, Rfl: 3 .  lisinopril (PRINIVIL,ZESTRIL) 40 MG tablet, Take 40 mg by mouth daily., Disp: , Rfl:  .  nicotine (NICODERM CQ - DOSED IN MG/24 HOURS) 21 mg/24hr patch, Place 1 patch (21 mg total) daily onto the skin., Disp: 28 patch, Rfl: 0 .  omeprazole (PRILOSEC) 40 MG capsule, Take 40 mg by mouth daily., Disp: , Rfl:  .  ondansetron (ZOFRAN) 8 MG tablet, Take 1 tablet (8 mg total) by mouth 2 (two) times daily as needed for refractory nausea / vomiting. Start on day 3 after carboplatin chemo., Disp: 30 tablet, Rfl: 1 .  Oxycodone HCl 10 MG TABS, Take 1 tablet (10 mg total) by mouth every 6  (six) hours as needed., Disp: 120 tablet, Rfl: 0 .  prochlorperazine (COMPAZINE) 10 MG tablet, Take 1 tablet (10 mg total) by mouth every 6 (six) hours as needed (Nausea or vomiting)., Disp: 30 tablet, Rfl: 1 .  sodium chloride 1 g tablet, Take 1 tablet (1 g total) by mouth 2 (two) times daily with a meal. (Patient not taking: Reported on 12/21/2016), Disp: 60 tablet, Rfl: 0 .  tadalafil (CIALIS) 10 MG tablet, Take 10 mg by mouth daily as needed for erectile dysfunction., Disp: , Rfl:   Physical exam:  Vitals:   12/28/16 0953  BP: 118/66  Pulse: 93  Temp: 98 F (36.7 C)  TempSrc: Tympanic  SpO2: 91%  Weight: 125 lb 4 oz (56.8 kg)   Physical Exam  Constitutional: He is oriented to person, place, and time and well-developed, well-nourished, and in no distress. No distress.  HENT:  Head: Normocephalic and atraumatic.  Mouth/Throat:  No oropharyngeal exudate.  Eyes: EOM are normal. Pupils are equal, round, and reactive to light. No scleral icterus.  Neck: Normal range of motion. Neck supple.  Cardiovascular: Normal rate, regular rhythm and normal heart sounds.  No murmur heard. Pulmonary/Chest: Breath sounds normal. No respiratory distress. He has no wheezes.  Abdominal: Soft. Bowel sounds are normal. He exhibits no distension. There is no tenderness.  Musculoskeletal: Normal range of motion.  S/p lumbar surgery. Prominent hardware, small skin opening .5cm with surrounding erythema. No discharge.   Lymphadenopathy:    He has cervical adenopathy.  Neurological: He is alert and oriented to person, place, and time.  Skin: Skin is warm and dry.  Psychiatric: Affect normal.    CMP Latest Ref Rng & Units 12/23/2016  Glucose 65 - 99 mg/dL 96  BUN 6 - 20 mg/dL 10  Creatinine 0.61 - 1.24 mg/dL 0.33(L)  Sodium 135 - 145 mmol/L 125(L)  Potassium 3.5 - 5.1 mmol/L 3.7  Chloride 101 - 111 mmol/L 89(L)  CO2 22 - 32 mmol/L 27  Calcium 8.9 - 10.3 mg/dL 8.9  Total Protein 6.5 - 8.1 g/dL -    Total Bilirubin 0.3 - 1.2 mg/dL -  Alkaline Phos 38 - 126 U/L -  AST 15 - 41 U/L -  ALT 17 - 63 U/L -   CBC Latest Ref Rng & Units 12/21/2016  WBC 3.8 - 10.6 K/uL 8.8  Hemoglobin 13.0 - 18.0 g/dL 11.8(L)  Hematocrit 40.0 - 52.0 % 33.5(L)  Platelets 150 - 440 K/uL 344     PET scan 12/10/2016  1. Large hypermetabolic mass in the LEFT lower lobe measuring up to 11 cm consists with bronchogenic carcinoma. 2. Direct extension of mass into the mediastinum the surrounding the LEFT lobe bronchus. 3. Extensive mediastinal hilar prevascular and bilateral supraclavicular metastatic adenopathy. Metastatic adenopathy also in the level 5 node extension in LEFT axilla. 4. 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 5. Stage IV bronchogenic carcinoma.   # CT negative for brain metastasis. Can not tolerate MRI brain.   Assessment and plan Patient is a 56 y.o. male present with extensive stage small cell lung cancer, currently on first line chemotherapy treatment with carboplatin and Etoposide Q21d x 4 with Onpro.  Cancer Staging Small cell lung cancer [facility]) Staging form: Lung, AJCC 8th Edition - Clinical stage from 12/10/2016: Stage IV (cT4, cN3, cM1c) - Signed by Earlie Server, MD on 12/10/2016  1. Small cell lung cancer ([place])   2. Hyponatremia   3. Tobacco abuse counseling   4. Orthopedic hardware present   5. 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  Continue oral salt tablets 1g BID.   # Smoking cessation was discussed with patient and cessation program information was given to patient.  # ETOH cessation discussed with patient.  # Prominent back hardware with skin wound. Called orthopdiacs Dr.Dommig's office patient will be evaluated by their office today.  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 Originally planned to have zometa. Hold  zometa for now until clear about ortho plan regarding his back wound.    Follow up on 2 weeks for evaluation prior to Carbo/Etoposide/onpro  Total face to face encounter time for this patient visit was 40 min. >50% of the time was  spent in counseling and coordination of care.  Earlie Server, MD, PhD Hematology Oncology  [place] [facility] [place]- [phone] 12/27/2016

## 2016-12-28 ENCOUNTER — Other Ambulatory Visit: Payer: Self-pay

## 2016-12-28 ENCOUNTER — Inpatient Hospital Stay: Payer: Medicare Other

## 2016-12-28 ENCOUNTER — Inpatient Hospital Stay: Payer: Medicare Other | Attending: Oncology | Admitting: Oncology

## 2016-12-28 ENCOUNTER — Encounter: Payer: Self-pay | Admitting: Oncology

## 2016-12-28 VITALS — BP 118/66 | HR 93 | Temp 98.0°F | Wt 125.2 lb

## 2016-12-28 DIAGNOSIS — C770 Secondary and unspecified malignant neoplasm of lymph nodes of head, face and neck: Secondary | ICD-10-CM | POA: Insufficient documentation

## 2016-12-28 DIAGNOSIS — Z716 Tobacco abuse counseling: Secondary | ICD-10-CM

## 2016-12-28 DIAGNOSIS — E871 Hypo-osmolality and hyponatremia: Secondary | ICD-10-CM

## 2016-12-28 DIAGNOSIS — C801 Malignant (primary) neoplasm, unspecified: Secondary | ICD-10-CM

## 2016-12-28 DIAGNOSIS — Z5111 Encounter for antineoplastic chemotherapy: Secondary | ICD-10-CM | POA: Insufficient documentation

## 2016-12-28 DIAGNOSIS — C7951 Secondary malignant neoplasm of bone: Secondary | ICD-10-CM | POA: Insufficient documentation

## 2016-12-28 DIAGNOSIS — Z978 Presence of other specified devices: Secondary | ICD-10-CM

## 2016-12-28 DIAGNOSIS — Z9981 Dependence on supplemental oxygen: Secondary | ICD-10-CM | POA: Insufficient documentation

## 2016-12-28 DIAGNOSIS — E222 Syndrome of inappropriate secretion of antidiuretic hormone: Secondary | ICD-10-CM | POA: Diagnosis not present

## 2016-12-28 DIAGNOSIS — C3490 Malignant neoplasm of unspecified part of unspecified bronchus or lung: Secondary | ICD-10-CM

## 2016-12-28 DIAGNOSIS — G893 Neoplasm related pain (acute) (chronic): Secondary | ICD-10-CM | POA: Insufficient documentation

## 2016-12-28 DIAGNOSIS — T148XXA Other injury of unspecified body region, initial encounter: Secondary | ICD-10-CM

## 2016-12-28 DIAGNOSIS — K219 Gastro-esophageal reflux disease without esophagitis: Secondary | ICD-10-CM | POA: Insufficient documentation

## 2016-12-28 DIAGNOSIS — F17210 Nicotine dependence, cigarettes, uncomplicated: Secondary | ICD-10-CM | POA: Diagnosis not present

## 2016-12-28 DIAGNOSIS — I10 Essential (primary) hypertension: Secondary | ICD-10-CM | POA: Insufficient documentation

## 2016-12-28 DIAGNOSIS — Z79899 Other long term (current) drug therapy: Secondary | ICD-10-CM | POA: Diagnosis not present

## 2016-12-28 DIAGNOSIS — C3432 Malignant neoplasm of lower lobe, left bronchus or lung: Secondary | ICD-10-CM

## 2016-12-28 LAB — COMPREHENSIVE METABOLIC PANEL
ALT: 9 U/L — AB (ref 17–63)
AST: 20 U/L (ref 15–41)
Albumin: 3.3 g/dL — ABNORMAL LOW (ref 3.5–5.0)
Alkaline Phosphatase: 130 U/L — ABNORMAL HIGH (ref 38–126)
Anion gap: 9 (ref 5–15)
BUN: 8 mg/dL (ref 6–20)
CHLORIDE: 87 mmol/L — AB (ref 101–111)
CO2: 26 mmol/L (ref 22–32)
CREATININE: 0.43 mg/dL — AB (ref 0.61–1.24)
Calcium: 8.7 mg/dL — ABNORMAL LOW (ref 8.9–10.3)
GFR calc Af Amer: >60 mL/min (ref >60)
GLUCOSE: 131 mg/dL — AB (ref 65–99)
POTASSIUM: 3.5 mmol/L (ref 3.5–5.1)
Sodium: 122 mmol/L — ABNORMAL LOW (ref 135–145)
Total Bilirubin: 0.7 mg/dL (ref 0.3–1.2)
Total Protein: 6.4 g/dL — ABNORMAL LOW (ref 6.5–8.1)

## 2016-12-28 LAB — CBC WITH DIFFERENTIAL/PLATELET
Basophils Absolute: 0.2 10*3/uL — ABNORMAL HIGH (ref 0–0.1)
Basophils Relative: 1 %
EOS ABS: 0.1 10*3/uL (ref 0–0.7)
EOS PCT: 0 %
HCT: 32.4 % — ABNORMAL LOW (ref 40.0–52.0)
Hemoglobin: 11.1 g/dL — ABNORMAL LOW (ref 13.0–18.0)
LYMPHS ABS: 0.5 10*3/uL — AB (ref 1.0–3.6)
LYMPHS PCT: 3 %
MCH: 32.2 pg (ref 26.0–34.0)
MCHC: 34.2 g/dL (ref 32.0–36.0)
MCV: 94 fL (ref 80.0–100.0)
MONO ABS: 1 10*3/uL (ref 0.2–1.0)
MONOS PCT: 5 %
Neutro Abs: 18.4 10*3/uL — ABNORMAL HIGH (ref 1.4–6.5)
Neutrophils Relative %: 91 %
PLATELETS: 305 10*3/uL (ref 150–440)
RBC: 3.45 MIL/uL — ABNORMAL LOW (ref 4.40–5.90)
RDW: 15 % — AB (ref 11.5–14.5)
WBC: 20.2 10*3/uL — AB (ref 3.8–10.6)

## 2016-12-28 MED ORDER — HEPARIN SOD (PORK) LOCK FLUSH 100 UNIT/ML IV SOLN
500.0000 [IU] | Freq: Once | INTRAVENOUS | Status: AC
  Administered 2016-12-28: 500 [IU] via INTRAVENOUS

## 2016-12-28 MED ORDER — SODIUM CHLORIDE 0.9% FLUSH
10.0000 mL | INTRAVENOUS | Status: DC | PRN
  Administered 2016-12-28: 10 mL via INTRAVENOUS
  Filled 2016-12-28: qty 10

## 2016-12-28 NOTE — Progress Notes (Signed)
Patient here today for follow up  Patient c/o bilateral edema in ankles, increasing back pain and constipation

## 2017-01-10 ENCOUNTER — Other Ambulatory Visit: Payer: Self-pay | Admitting: Oncology

## 2017-01-10 NOTE — Progress Notes (Signed)
START ON PATHWAY REGIMEN - Small Cell Lung     Cycles 1 through 4, every 21 days:     Atezolizumab      Carboplatin      Etoposide    Cycles 5 and beyond, every 21 days:     Atezolizumab   **Always confirm dose/schedule in your pharmacy ordering system**    Patient Characteristics: Extensive Stage, First Line Stage Classification: Extensive AJCC T Category: T4 AJCC N Category: N3 AJCC M Category: M1c AJCC 8 Stage Grouping: IVB Line of therapy: First Line Would you be surprised if this patient died  in the next year<= I would be surprised if this patient died in the next year Intent of Therapy: Non-Curative / Palliative Intent, Discussed with Patient

## 2017-01-10 NOTE — Progress Notes (Signed)
DISCONTINUE ON PATHWAY REGIMEN - Small Cell Lung     A cycle is every 21 days:     Etoposide      Carboplatin   **Always confirm dose/schedule in your pharmacy ordering system**

## 2017-01-10 NOTE — Progress Notes (Unsigned)
Hematology/Oncology Follow Up Note [facility]  Telephone:([phone] [phone]  Patient Care Team: Valera Castle, MD as PCP - General (Family Medicine) Telford Nab, RN as Registered Nurse   Name of the patient: Steven Davis  [id number]  Aug 22, 1960   REASON FOR VISIT Following up for management of small cell lung cancer.  HISTORY OF PRESENT ILLNESS Patient was seen and examined the bedside. He reports improvement of his breathing. He had a good night's sleep. Continue to have intermittent cough. He had CT and brain MRI done. He couldn't tolerate brain MRI.CT abdomen and pelvis CT abdomen and pelvis showed multiple hypodense liver lesions. He is also scheduled to have ultrasound-guided biopsy of his left supraclavicular lymph node.He tells me that he usually lies on his right side because of chronic back problem. He feels more comfortable breathing today  INTERVAL HISTORY Patient presents to follow up for evaluation of the toxicities after cycle 1 chemotherapy. Patient reports feeling tired. Denies any fever or chills. Breathing is at baseline. He uses home oxygen. He continues to have left-sided back pain. He also reports to me that his spine surgery hardware is sticking out. Continued to have intermittent coughing, denies any hemoptysis.  Current pain medication : Offered him long-acting narcotics in combination with short acting. Patient declines. He is currently on oxycodone 10 mg every 6 hours as needed. He reports usually taking 3-4 oxycodone per day. His pain levels were 10 which relieved to about 6-7 with pain medication.    Review of Systems  Constitutional: Positive for malaise/fatigue. Negative for chills, fever and weight loss.  HENT: Negative for ear discharge and hearing loss.   Eyes: Negative for blurred vision and double vision.  Respiratory: Positive for cough and shortness of breath. Negative for hemoptysis and sputum  production.   Cardiovascular: Negative for chest pain, palpitations and leg swelling.  Gastrointestinal: Negative for abdominal pain, heartburn, nausea and vomiting.  Genitourinary: Negative for dysuria and urgency.  Musculoskeletal: Positive for back pain. Negative for myalgias.       Left side rib pain.  Wound on back.   Skin: Negative for itching and rash.  Neurological: Negative for dizziness and headaches.  Endo/Heme/Allergies: Negative for environmental allergies. Does not bruise/bleed easily.  Psychiatric/Behavioral: Negative for depression and suicidal ideas.     Allergies  Allergen Reactions  . Aspirin Other (See Comments)    History of GI bleed  . Benadryl [Diphenhydramine Hcl (Sleep)] Nausea And Vomiting     Past Medical History:  Diagnosis Date  . Anemia   . Cancer ([place])   . Erectile dysfunction   . GERD (gastroesophageal reflux disease)   . Hypertension      Past Surgical History:  Procedure Laterality Date  . BACK SURGERY    . ESOPHAGOGASTRODUODENOSCOPY (EGD) WITH PROPOFOL Left 11/22/2014   Procedure: ESOPHAGOGASTRODUODENOSCOPY (EGD) WITH PROPOFOL;  Surgeon: Manya Silvas, MD;  Location: [facility] ENDOSCOPY;  Service: Endoscopy;  Laterality: Left;  . NECK SURGERY    . PORTA CATH INSERTION N/A 12/20/2016   Procedure: PORTA CATH INSERTION;  Surgeon: Algernon Huxley, MD;  Location: [place] CV LAB;  Service: Cardiovascular;  Laterality: N/A;    Social History   Socioeconomic History  . Marital status: Single    Spouse name: Not on file  . Number of children: Not on file  . Years of education: Not on file  . Highest education level: Not on file  Social Needs  . Emergency planning/management officer  strain: Not on file  . Food insecurity - worry: Not on file  . Food insecurity - inability: Not on file  . Transportation needs - medical: Not on file  . Transportation needs - non-medical: Not on file  Occupational History  . Not on file  Tobacco Use  . Smoking status:  Current Every Day Smoker    Packs/day: 1.00    Types: Cigarettes  . Smokeless tobacco: Never Used  . Tobacco comment: cut back to approx half pack daily   Substance and Sexual Activity  . Alcohol use: Yes    Alcohol/week: 1.2 - 1.8 oz    Types: 2 - 3 Cans of beer per week  . Drug use: No  . Sexual activity: Not on file  Other Topics Concern  . Not on file  Social History Narrative  . Not on file    Family History  Problem Relation Age of Onset  . Hypertension Father      Current Outpatient Medications:  .  albuterol (PROVENTIL HFA;VENTOLIN HFA) 108 (90 BASE) MCG/ACT inhaler, Inhale 2 puffs into the lungs every 6 (six) hours as needed for wheezing or shortness of breath., Disp: , Rfl:  .  amLODipine (NORVASC) 10 MG tablet, Take 10 mg by mouth daily., Disp: , Rfl:  .  docusate sodium (COLACE) 100 MG capsule, Take 1 capsule (100 mg total) 2 (two) times daily as needed by mouth for mild constipation., Disp: 10 capsule, Rfl: 0 .  feeding supplement, ENSURE ENLIVE, (ENSURE ENLIVE) LIQD, Take 237 mLs 2 (two) times daily between meals by mouth., Disp: 237 mL, Rfl: 12 .  ferrous sulfate 325 (65 FE) MG tablet, Take 325 mg daily by mouth., Disp: , Rfl:  .  Fluticasone-Salmeterol (ADVAIR DISKUS) 250-50 MCG/DOSE AEPB, Inhale 1 puff 2 (two) times daily into the lungs., Disp: 60 each, Rfl: 1 .  lidocaine-prilocaine (EMLA) cream, Apply to affected area once, Disp: 30 g, Rfl: 3 .  lisinopril (PRINIVIL,ZESTRIL) 40 MG tablet, Take 40 mg by mouth daily., Disp: , Rfl:  .  nicotine (NICODERM CQ - DOSED IN MG/24 HOURS) 21 mg/24hr patch, Place 1 patch (21 mg total) daily onto the skin., Disp: 28 patch, Rfl: 0 .  omeprazole (PRILOSEC) 40 MG capsule, Take 40 mg by mouth daily., Disp: , Rfl:  .  ondansetron (ZOFRAN) 8 MG tablet, Take 1 tablet (8 mg total) by mouth 2 (two) times daily as needed for refractory nausea / vomiting. Start on day 3 after carboplatin chemo., Disp: 30 tablet, Rfl: 1 .  Oxycodone HCl  10 MG TABS, Take 1 tablet (10 mg total) by mouth every 6 (six) hours as needed., Disp: 120 tablet, Rfl: 0 .  prochlorperazine (COMPAZINE) 10 MG tablet, Take 1 tablet (10 mg total) by mouth every 6 (six) hours as needed (Nausea or vomiting)., Disp: 30 tablet, Rfl: 1 .  sodium chloride 1 g tablet, Take 1 tablet (1 g total) by mouth 2 (two) times daily with a meal., Disp: 60 tablet, Rfl: 0  Physical exam:  There were no vitals filed for this visit. Physical Exam  Constitutional: He is oriented to person, place, and time and well-developed, well-nourished, and in no distress. No distress.  HENT:  Head: Normocephalic and atraumatic.  Mouth/Throat: No oropharyngeal exudate.  Eyes: EOM are normal. Pupils are equal, round, and reactive to light. No scleral icterus.  Neck: Normal range of motion. Neck supple.  Cardiovascular: Normal rate, regular rhythm and normal heart sounds.  No murmur heard.  Pulmonary/Chest: Breath sounds normal. No respiratory distress. He has no wheezes.  Abdominal: Soft. Bowel sounds are normal. He exhibits no distension. There is no tenderness.  Musculoskeletal: Normal range of motion.  S/p lumbar surgery. Prominent hardware, small skin opening .5cm with surrounding erythema. No discharge.   Lymphadenopathy:    He has cervical adenopathy.  Neurological: He is alert and oriented to person, place, and time.  Skin: Skin is warm and dry.  Psychiatric: Affect normal.    CMP Latest Ref Rng & Units 12/28/2016  Glucose 65 - 99 mg/dL 131(H)  BUN 6 - 20 mg/dL 8  Creatinine 0.61 - 1.24 mg/dL 0.43(L)  Sodium 135 - 145 mmol/L 122(L)  Potassium 3.5 - 5.1 mmol/L 3.5  Chloride 101 - 111 mmol/L 87(L)  CO2 22 - 32 mmol/L 26  Calcium 8.9 - 10.3 mg/dL 8.7(L)  Total Protein 6.5 - 8.1 g/dL 6.4(L)  Total Bilirubin 0.3 - 1.2 mg/dL 0.7  Alkaline Phos 38 - 126 U/L 130(H)  AST 15 - 41 U/L 20  ALT 17 - 63 U/L 9(L)   CBC Latest Ref Rng & Units 12/28/2016  WBC 3.8 - 10.6 K/uL 20.2(H)    Hemoglobin 13.0 - 18.0 g/dL 11.1(L)  Hematocrit 40.0 - 52.0 % 32.4(L)  Platelets 150 - 440 K/uL 305     PET scan 12/10/2016  1. Large hypermetabolic mass in the LEFT lower lobe measuring up to 11 cm consists with bronchogenic carcinoma. 2. Direct extension of mass into the mediastinum the surrounding the LEFT lobe bronchus. 3. Extensive mediastinal hilar prevascular and bilateral supraclavicular metastatic adenopathy. Metastatic adenopathy also in the level 5 node extension in LEFT axilla. 4. 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 5. Stage IV bronchogenic carcinoma.   # CT negative for brain metastasis. Can not tolerate MRI brain.   Assessment and plan Patient is a 56 y.o. male present with extensive stage small cell lung cancer, currently on first line chemotherapy treatment with carboplatin and Etoposide Q21d x 4 with Onpro.  Cancer Staging Small cell lung cancer [facility]) Staging form: Lung, AJCC 8th Edition - Clinical stage from 12/10/2016: Stage IV (cT4, cN3, cM1c) - Signed by Earlie Server, MD on 12/10/2016  No diagnosis found.  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  Continue oral salt tablets 1g BID.   # Smoking cessation was discussed with patient and cessation program information was given to patient.  # ETOH cessation discussed with patient.  # Prominent back hardware with skin wound. Called orthopdiacs Dr.Dommig's office patient will be evaluated by their office today.  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 Originally planned to have zometa. Hold zometa for now until clear about ortho plan regarding his back wound.    Follow up on 2 weeks for evaluation prior to Carbo/Etoposide/onpro  Total face to face encounter time for this patient visit was 40 min. >50% of the time was  spent in counseling and coordination of care.  Earlie Server,  MD, PhD Hematology Oncology [facility] Pager- [phone] 01/10/2017

## 2017-01-11 ENCOUNTER — Inpatient Hospital Stay: Payer: Medicare Other

## 2017-01-11 ENCOUNTER — Inpatient Hospital Stay (HOSPITAL_BASED_OUTPATIENT_CLINIC_OR_DEPARTMENT_OTHER): Payer: Medicare Other | Admitting: Oncology

## 2017-01-11 ENCOUNTER — Encounter: Payer: Self-pay | Admitting: Oncology

## 2017-01-11 ENCOUNTER — Other Ambulatory Visit: Payer: Self-pay

## 2017-01-11 VITALS — BP 129/81 | HR 81 | Temp 97.2°F | Resp 18 | Wt 122.1 lb

## 2017-01-11 DIAGNOSIS — Z716 Tobacco abuse counseling: Secondary | ICD-10-CM

## 2017-01-11 DIAGNOSIS — C3432 Malignant neoplasm of lower lobe, left bronchus or lung: Secondary | ICD-10-CM

## 2017-01-11 DIAGNOSIS — C3490 Malignant neoplasm of unspecified part of unspecified bronchus or lung: Secondary | ICD-10-CM

## 2017-01-11 DIAGNOSIS — Z5111 Encounter for antineoplastic chemotherapy: Secondary | ICD-10-CM | POA: Diagnosis not present

## 2017-01-11 DIAGNOSIS — Z79899 Other long term (current) drug therapy: Secondary | ICD-10-CM | POA: Diagnosis not present

## 2017-01-11 DIAGNOSIS — F17210 Nicotine dependence, cigarettes, uncomplicated: Secondary | ICD-10-CM

## 2017-01-11 DIAGNOSIS — C7951 Secondary malignant neoplasm of bone: Secondary | ICD-10-CM | POA: Diagnosis not present

## 2017-01-11 DIAGNOSIS — G893 Neoplasm related pain (acute) (chronic): Secondary | ICD-10-CM

## 2017-01-11 DIAGNOSIS — E222 Syndrome of inappropriate secretion of antidiuretic hormone: Secondary | ICD-10-CM | POA: Diagnosis not present

## 2017-01-11 DIAGNOSIS — T148XXA Other injury of unspecified body region, initial encounter: Secondary | ICD-10-CM

## 2017-01-11 DIAGNOSIS — C770 Secondary and unspecified malignant neoplasm of lymph nodes of head, face and neck: Secondary | ICD-10-CM

## 2017-01-11 DIAGNOSIS — E871 Hypo-osmolality and hyponatremia: Secondary | ICD-10-CM

## 2017-01-11 DIAGNOSIS — Z978 Presence of other specified devices: Secondary | ICD-10-CM

## 2017-01-11 LAB — TSH: TSH: 0.828 u[IU]/mL (ref 0.350–4.500)

## 2017-01-11 LAB — COMPREHENSIVE METABOLIC PANEL
ALK PHOS: 137 U/L — AB (ref 38–126)
ALT: 8 U/L — ABNORMAL LOW (ref 17–63)
ANION GAP: 9 (ref 5–15)
AST: 16 U/L (ref 15–41)
Albumin: 3.4 g/dL — ABNORMAL LOW (ref 3.5–5.0)
BILIRUBIN TOTAL: 0.4 mg/dL (ref 0.3–1.2)
BUN: 9 mg/dL (ref 6–20)
CALCIUM: 8.9 mg/dL (ref 8.9–10.3)
CO2: 24 mmol/L (ref 22–32)
Chloride: 96 mmol/L — ABNORMAL LOW (ref 101–111)
Creatinine, Ser: 0.5 mg/dL — ABNORMAL LOW (ref 0.61–1.24)
GFR calc non Af Amer: >60 mL/min (ref >60)
Glucose, Bld: 96 mg/dL (ref 65–99)
Potassium: 4.4 mmol/L (ref 3.5–5.1)
SODIUM: 129 mmol/L — AB (ref 135–145)
TOTAL PROTEIN: 6.7 g/dL (ref 6.5–8.1)

## 2017-01-11 LAB — CBC WITH DIFFERENTIAL/PLATELET
Basophils Absolute: 0.1 10*3/uL (ref 0–0.1)
Basophils Relative: 2 %
EOS ABS: 0 10*3/uL (ref 0–0.7)
Eosinophils Relative: 0 %
HEMATOCRIT: 38.3 % — AB (ref 40.0–52.0)
HEMOGLOBIN: 13 g/dL (ref 13.0–18.0)
LYMPHS ABS: 0.7 10*3/uL — AB (ref 1.0–3.6)
Lymphocytes Relative: 11 %
MCH: 32.3 pg (ref 26.0–34.0)
MCHC: 34 g/dL (ref 32.0–36.0)
MCV: 95 fL (ref 80.0–100.0)
MONO ABS: 0.9 10*3/uL (ref 0.2–1.0)
MONOS PCT: 14 %
NEUTROS PCT: 73 %
Neutro Abs: 4.8 10*3/uL (ref 1.4–6.5)
Platelets: 605 10*3/uL — ABNORMAL HIGH (ref 150–440)
RBC: 4.03 MIL/uL — ABNORMAL LOW (ref 4.40–5.90)
RDW: 17.2 % — ABNORMAL HIGH (ref 11.5–14.5)
WBC: 6.6 10*3/uL (ref 3.8–10.6)

## 2017-01-11 MED ORDER — CHLORHEXIDINE GLUCONATE 0.12 % MT SOLN: 15.0000 mL | Freq: Two times a day (BID) | OROMUCOSAL | 0 refills | Status: AC

## 2017-01-11 MED ORDER — OXYCODONE HCL 10 MG PO TABS: 10.0000 mg | ORAL_TABLET | Freq: Four times a day (QID) | ORAL | 0 refills | Status: DC | PRN

## 2017-01-11 MED ORDER — HEPARIN SOD (PORK) LOCK FLUSH 100 UNIT/ML IV SOLN
500.0000 [IU] | Freq: Once | INTRAVENOUS | Status: AC
  Administered 2017-01-11: 500 [IU] via INTRAVENOUS
  Filled 2017-01-11: qty 5

## 2017-01-11 MED ORDER — SODIUM CHLORIDE 0.9 % IV SOLN: 10.0000 mg | Freq: Once | INTRAVENOUS | Status: DC

## 2017-01-11 MED ORDER — DEXAMETHASONE SODIUM PHOSPHATE 10 MG/ML IJ SOLN
10.0000 mg | Freq: Once | INTRAMUSCULAR | Status: AC
  Administered 2017-01-11: 10 mg via INTRAVENOUS
  Filled 2017-01-11: qty 1

## 2017-01-11 MED ORDER — SODIUM CHLORIDE 0.9% FLUSH
10.0000 mL | INTRAVENOUS | Status: DC | PRN
  Administered 2017-01-11: 10 mL via INTRAVENOUS
  Filled 2017-01-11: qty 10

## 2017-01-11 MED ORDER — SODIUM CHLORIDE 0.9 % IV SOLN
Freq: Once | INTRAVENOUS | Status: AC
  Administered 2017-01-11: 10:00:00 via INTRAVENOUS
  Filled 2017-01-11: qty 1000

## 2017-01-11 MED ORDER — SODIUM CHLORIDE 0.9 % IV SOLN
547.0000 mg | Freq: Once | INTRAVENOUS | Status: AC
  Administered 2017-01-11: 550 mg via INTRAVENOUS
  Filled 2017-01-11: qty 55

## 2017-01-11 MED ORDER — OXYCODONE HCL ER 10 MG PO T12A: 10.0000 mg | EXTENDED_RELEASE_TABLET | Freq: Two times a day (BID) | ORAL | 0 refills | Status: DC

## 2017-01-11 MED ORDER — SODIUM CHLORIDE 0.9 % IV SOLN
100.0000 mg/m2 | Freq: Once | INTRAVENOUS | Status: AC
  Administered 2017-01-11: 160 mg via INTRAVENOUS
  Filled 2017-01-11: qty 8

## 2017-01-11 MED ORDER — PALONOSETRON HCL INJECTION 0.25 MG/5ML
0.2500 mg | Freq: Once | INTRAVENOUS | Status: AC
  Administered 2017-01-11: 0.25 mg via INTRAVENOUS
  Filled 2017-01-11: qty 5

## 2017-01-11 NOTE — Progress Notes (Signed)
Here for follow up. Stated palliative care sp w him yday and wanted him on long acting pain meds-shared w Dr Tasia Catchings

## 2017-01-11 NOTE — Progress Notes (Signed)
Hematology/Oncology Follow Up Note [facility]  Telephone:([phone] [phone]  Patient Care Team: Valera Castle, MD as PCP - General (Family Medicine) Telford Nab, RN as Registered Nurse   Name of the patient: Steven Davis  [id number]  08/08/1960   REASON FOR VISIT Following up for management of small cell lung cancer.  HISTORY OF PRESENT ILLNESS Patient was seen and examined the bedside. He reports improvement of his breathing. He had a good night's sleep. Continue to have intermittent cough. He had CT and brain MRI done. He couldn't tolerate brain MRI.CT abdomen and pelvis CT abdomen and pelvis showed multiple hypodense liver lesions. He is also scheduled to have ultrasound-guided biopsy of his left supraclavicular lymph node.He tells me that he usually lies on his right side because of chronic back problem. He feels more comfortable breathing today  INTERVAL HISTORY Patient presents to follow up for evaluation of the toxicities after cycle 2 chemotherapy. Patient reports feeling fatigue, and he starts to have more pain at his left rib cage, worsen with dry cough. He saw orthopedic surgeon and was started on Keflex daily.  Continued to have intermittent dry cough, no hemoptysis.  Current pain medication :taking oxycodone 10mg  Q4-6h. Pain not controlled.  Patient feels life quality is not good and is tearful.  Wife and daughter accompanied him.   Review of Systems  Constitutional: Positive for malaise/fatigue. Negative for chills, fever and weight loss.  HENT: Negative for ear discharge, ear pain and hearing loss.   Eyes: Negative for blurred vision, double vision and photophobia.  Respiratory: Positive for cough and shortness of breath. Negative for hemoptysis and sputum production.   Cardiovascular: Negative for chest pain, palpitations, orthopnea and leg swelling.  Gastrointestinal: Negative for abdominal pain, diarrhea, heartburn,  nausea and vomiting.  Genitourinary: Negative for dysuria, frequency and urgency.  Musculoskeletal: Positive for back pain. Negative for myalgias and neck pain.       Left side rib pain.  Wound on back.   Skin: Negative for itching and rash.  Neurological: Positive for weakness. Negative for dizziness, tingling and headaches.  Endo/Heme/Allergies: Negative for environmental allergies. Does not bruise/bleed easily.  Psychiatric/Behavioral: Negative for depression, substance abuse and suicidal ideas.     Allergies  Allergen Reactions  . Aspirin Other (See Comments)    History of GI bleed  . Benadryl [Diphenhydramine Hcl (Sleep)] Nausea And Vomiting     Past Medical History:  Diagnosis Date  . Anemia   . Cancer ([place])   . Erectile dysfunction   . GERD (gastroesophageal reflux disease)   . Hypertension      Past Surgical History:  Procedure Laterality Date  . BACK SURGERY    . ESOPHAGOGASTRODUODENOSCOPY (EGD) WITH PROPOFOL Left 11/22/2014   Procedure: ESOPHAGOGASTRODUODENOSCOPY (EGD) WITH PROPOFOL;  Surgeon: Manya Silvas, MD;  Location: [facility] ENDOSCOPY;  Service: Endoscopy;  Laterality: Left;  . NECK SURGERY    . PORTA CATH INSERTION N/A 12/20/2016   Procedure: PORTA CATH INSERTION;  Surgeon: Algernon Huxley, MD;  Location: [place] CV LAB;  Service: Cardiovascular;  Laterality: N/A;    Social History   Socioeconomic History  . Marital status: Single    Spouse name: Not on file  . Number of children: Not on file  . Years of education: Not on file  . Highest education level: Not on file  Social Needs  . Financial resource strain: Not on file  . Food insecurity - worry: Not on file  .  Food insecurity - inability: Not on file  . Transportation needs - medical: Not on file  . Transportation needs - non-medical: Not on file  Occupational History  . Not on file  Tobacco Use  . Smoking status: Current Every Day Smoker    Packs/day: 1.00    Types: Cigarettes  .  Smokeless tobacco: Never Used  . Tobacco comment: cut back to approx half pack daily   Substance and Sexual Activity  . Alcohol use: Yes    Alcohol/week: 1.2 - 1.8 oz    Types: 2 - 3 Cans of beer per week  . Drug use: No  . Sexual activity: Not on file  Other Topics Concern  . Not on file  Social History Narrative  . Not on file    Family History  Problem Relation Age of Onset  . Hypertension Father      Current Outpatient Medications:  .  amLODipine (NORVASC) 10 MG tablet, Take 10 mg by mouth daily., Disp: , Rfl:  .  cephALEXin (KEFLEX) 500 MG capsule, Keflex 500 mg capsule  Take 1 capsule every 6 hours by oral route., Disp: , Rfl:  .  docusate sodium (COLACE) 100 MG capsule, Take 1 capsule (100 mg total) 2 (two) times daily as needed by mouth for mild constipation., Disp: 10 capsule, Rfl: 0 .  ferrous sulfate 325 (65 FE) MG tablet, Take 325 mg daily by mouth., Disp: , Rfl:  .  Fluticasone-Salmeterol (ADVAIR DISKUS) 250-50 MCG/DOSE AEPB, Inhale 1 puff 2 (two) times daily into the lungs., Disp: 60 each, Rfl: 1 .  lidocaine-prilocaine (EMLA) cream, Apply to affected area once, Disp: 30 g, Rfl: 3 .  lisinopril (PRINIVIL,ZESTRIL) 40 MG tablet, Take 40 mg by mouth daily., Disp: , Rfl:  .  nicotine (NICODERM CQ - DOSED IN MG/24 HOURS) 21 mg/24hr patch, Place 1 patch (21 mg total) daily onto the skin., Disp: 28 patch, Rfl: 0 .  omeprazole (PRILOSEC) 40 MG capsule, Take 40 mg by mouth daily., Disp: , Rfl:  .  sodium chloride 1 g tablet, Take 1 tablet (1 g total) by mouth 2 (two) times daily with a meal., Disp: 60 tablet, Rfl: 0 .  albuterol (PROVENTIL HFA;VENTOLIN HFA) 108 (90 BASE) MCG/ACT inhaler, Inhale 2 puffs into the lungs every 6 (six) hours as needed for wheezing or shortness of breath., Disp: , Rfl:  .  chlorhexidine (PERIDEX) 0.12 % solution, Use as directed 15 mLs in the mouth or throat 2 (two) times daily., Disp: 473 mL, Rfl: 0 .  feeding supplement, ENSURE ENLIVE, (ENSURE  ENLIVE) LIQD, Take 237 mLs 2 (two) times daily between meals by mouth. (Patient not taking: Reported on 01/11/2017), Disp: 237 mL, Rfl: 12 .  ondansetron (ZOFRAN) 8 MG tablet, Take 1 tablet (8 mg total) by mouth 2 (two) times daily as needed for refractory nausea / vomiting. Start on day 3 after carboplatin chemo. (Patient not taking: Reported on 01/11/2017), Disp: 30 tablet, Rfl: 1 .  oxyCODONE (OXYCONTIN) 10 mg 12 hr tablet, Take 1 tablet (10 mg total) by mouth every 12 (twelve) hours., Disp: 14 tablet, Rfl: 0 .  Oxycodone HCl 10 MG TABS, Take 1 tablet (10 mg total) by mouth every 6 (six) hours as needed., Disp: 120 tablet, Rfl: 0 .  prochlorperazine (COMPAZINE) 10 MG tablet, Take 1 tablet (10 mg total) by mouth every 6 (six) hours as needed (Nausea or vomiting). (Patient not taking: Reported on 01/11/2017), Disp: 30 tablet, Rfl: 1 No current facility-administered  medications for this visit.   Facility-Administered Medications Ordered in Other Visits:  .  CARBOplatin (PARAPLATIN) 550 mg in sodium chloride 0.9 % 250 mL chemo infusion, 550 mg, Intravenous, Once, Earlie Server, MD .  etoposide (VEPESID) 160 mg in sodium chloride 0.9 % 500 mL chemo infusion, 100 mg/m2 (Treatment Plan Recorded), Intravenous, Once, Earlie Server, MD, Last Rate: 508 mL/hr at 01/11/17 1020, 160 mg at 01/11/17 1020 .  heparin lock flush 100 unit/mL, 500 Units, Intravenous, Once, Earlie Server, MD .  sodium chloride flush (NS) 0.9 % injection 10 mL, 10 mL, Intravenous, PRN, Earlie Server, MD, 10 mL at 01/11/17 6644  Physical exam:  Vitals:   01/11/17 0843  BP: 129/81  Pulse: 81  Resp: 18  Temp: (!) 97.2 F (36.2 C)  TempSrc: Tympanic  Weight: 122 lb 1.6 oz (55.4 kg)   Physical Exam  Constitutional: He is oriented to person, place, and time and well-developed, well-nourished, and in no distress. No distress.  HENT:  Head: Normocephalic and atraumatic.  Mouth/Throat: No oropharyngeal exudate.  Eyes: Conjunctivae and EOM are normal.  Pupils are equal, round, and reactive to light. No scleral icterus.  Neck: Normal range of motion. Neck supple. No thyromegaly present.  Left supraclavicular lymphadenopathy resolved.   Cardiovascular: Normal rate, regular rhythm, normal heart sounds and intact distal pulses. Exam reveals no friction rub.  No murmur heard. Pulmonary/Chest: Effort normal and breath sounds normal. No respiratory distress. He has no wheezes. He has no rales.  Abdominal: Soft. Bowel sounds are normal. He exhibits no distension. There is no tenderness. There is no rebound and no guarding.  Musculoskeletal: Normal range of motion. He exhibits no edema.  S/p lumbar surgery. Prominent hardware, small skin opening .5cm with surrounding erythema. No discharge.   Lymphadenopathy:    He has no cervical adenopathy.  Neurological: He is alert and oriented to person, place, and time. No cranial nerve deficit.  Skin: Skin is warm and dry. No erythema.  Psychiatric: Affect normal.  tearful    CMP Latest Ref Rng & Units 12/28/2016  Glucose 65 - 99 mg/dL 131(H)  BUN 6 - 20 mg/dL 8  Creatinine 0.61 - 1.24 mg/dL 0.43(L)  Sodium 135 - 145 mmol/L 122(L)  Potassium 3.5 - 5.1 mmol/L 3.5  Chloride 101 - 111 mmol/L 87(L)  CO2 22 - 32 mmol/L 26  Calcium 8.9 - 10.3 mg/dL 8.7(L)  Total Protein 6.5 - 8.1 g/dL 6.4(L)  Total Bilirubin 0.3 - 1.2 mg/dL 0.7  Alkaline Phos 38 - 126 U/L 130(H)  AST 15 - 41 U/L 20  ALT 17 - 63 U/L 9(L)   CBC Latest Ref Rng & Units 12/28/2016  WBC 3.8 - 10.6 K/uL 20.2(H)  Hemoglobin 13.0 - 18.0 g/dL 11.1(L)  Hematocrit 40.0 - 52.0 % 32.4(L)  Platelets 150 - 440 K/uL 305     PET scan 12/10/2016  1. Large hypermetabolic mass in the LEFT lower lobe measuring up to 11 cm consists with bronchogenic carcinoma. 2. Direct extension of mass into the mediastinum the surrounding the LEFT lobe bronchus. 3. Extensive mediastinal hilar prevascular and bilateral supraclavicular metastatic adenopathy. Metastatic  adenopathy also in the level 5 node extension in LEFT axilla. 4. 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 5. Stage IV bronchogenic carcinoma.   # CT negative for brain metastasis. Can not tolerate MRI brain.   Assessment and plan Patient is a 56 y.o. male present with extensive stage small cell lung cancer, currently on  first line chemotherapy treatment with carboplatin and Etoposide Q21d x 4 with Onpro.  Cancer Staging Small cell lung cancer [facility]) Staging form: Lung, AJCC 8th Edition - Clinical stage from 12/10/2016: Stage IV (cT4, cN3, cM1c) - Signed by Earlie Server, MD on 12/10/2016  1. Encounter for antineoplastic chemotherapy   2. Small cell lung cancer ([place])   3. Hyponatremia   4. Tobacco abuse counseling   5. Orthopedic hardware present   6. Wound of skin     # Plan add immunotherapy with Atezolizumab Q21d to carboplatin and etoposide regimen give IMPOWER clinical trial data.   I discussed the mechanism of action; The goal of therapy is palliative; and length of treatments are likely ongoing/based upon the results of the scans. Discussed the potential side effects of immunotherapy including but not limited to diarrhea; skin rash; elevated LFTs/endocrine abnormalities etc. As patient feels pretty emotionally overwhelmed today, will plan to start with his cycle 3 treatment, not with cycle 2.  His left supraclavicular lymph node is no longer palpable, indicating a good response.  Proceed with cycle 2 Carboplain and Etoposide treatment with onpro support. Patient agreed with pain.   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   Sodium level improves to 129 today. Continue oral salt tablets 1g BID.   # Prominent back hardware with skin wound. Continue to follow up with Dr.Dommig's office, on Keflex.  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 Originally  planned to have zometa. Hold zometa for now until clear about ortho plan regarding his back wound. Discussed with Radonc, plan to start palliative RT to his left rib cage.   # Cancer related pain: start Oxycontin 10mg  BID plus oxycodone 10mg  Q4-6 PRN. Will further titrate his pain medication.  # Goal of care was discussed today again. Patient understands treatment is not curative, but agree to continue on treatment and see how he does for this cycle and how he feels after pain medication being adjusted.   Follow up on 1 week to evaluate pain control.  Total face to face encounter time for this patient visit was 40 min. >50% of the time was  spent in counseling and coordination of care.  Earlie Server, MD, PhD Hematology Oncology [facility] Pager- [phone] 01/11/2017

## 2017-01-12 ENCOUNTER — Inpatient Hospital Stay: Payer: Medicare Other

## 2017-01-12 ENCOUNTER — Other Ambulatory Visit: Payer: Self-pay | Admitting: *Deleted

## 2017-01-12 VITALS — BP 129/80 | HR 85 | Temp 97.6°F | Resp 18

## 2017-01-12 DIAGNOSIS — C3490 Malignant neoplasm of unspecified part of unspecified bronchus or lung: Secondary | ICD-10-CM

## 2017-01-12 DIAGNOSIS — Z5111 Encounter for antineoplastic chemotherapy: Secondary | ICD-10-CM | POA: Diagnosis not present

## 2017-01-12 MED ORDER — OXYCODONE HCL ER 10 MG PO T12A: 10.0000 mg | EXTENDED_RELEASE_TABLET | Freq: Two times a day (BID) | ORAL | 0 refills | Status: AC

## 2017-01-12 MED ORDER — DEXAMETHASONE SODIUM PHOSPHATE 10 MG/ML IJ SOLN
10.0000 mg | Freq: Once | INTRAMUSCULAR | Status: AC
  Administered 2017-01-12: 10 mg via INTRAVENOUS
  Filled 2017-01-12: qty 1

## 2017-01-12 MED ORDER — SODIUM CHLORIDE 0.9 % IV SOLN
100.0000 mg/m2 | Freq: Once | INTRAVENOUS | Status: AC
  Administered 2017-01-12: 160 mg via INTRAVENOUS
  Filled 2017-01-12: qty 8

## 2017-01-12 MED ORDER — SODIUM CHLORIDE 0.9 % IV SOLN: 10.0000 mg | Freq: Once | INTRAVENOUS | Status: DC

## 2017-01-12 MED ORDER — SODIUM CHLORIDE 0.9% FLUSH
10.0000 mL | INTRAVENOUS | Status: DC | PRN
  Administered 2017-01-12: 10 mL via INTRAVENOUS
  Filled 2017-01-12: qty 10

## 2017-01-12 MED ORDER — SODIUM CHLORIDE 0.9 % IV SOLN
Freq: Once | INTRAVENOUS | Status: AC
  Administered 2017-01-12: 14:00:00 via INTRAVENOUS
  Filled 2017-01-12: qty 1000

## 2017-01-12 MED ORDER — HEPARIN SOD (PORK) LOCK FLUSH 100 UNIT/ML IV SOLN
500.0000 [IU] | Freq: Once | INTRAVENOUS | Status: AC
  Administered 2017-01-12: 500 [IU] via INTRAVENOUS
  Filled 2017-01-12: qty 5

## 2017-01-12 MED ORDER — OXYCODONE HCL 10 MG PO TABS: 10.0000 mg | ORAL_TABLET | Freq: Four times a day (QID) | ORAL | 0 refills | Status: DC | PRN

## 2017-01-12 NOTE — Addendum Note (Signed)
Addended by: Earlie Server on: 01/12/2017 03:10 PM   Modules accepted: Orders

## 2017-01-12 NOTE — Addendum Note (Signed)
Addended by: Earlie Server on: 01/12/2017 03:15 PM   Modules accepted: Orders

## 2017-01-12 NOTE — Progress Notes (Signed)
Open in error

## 2017-01-13 ENCOUNTER — Inpatient Hospital Stay: Payer: Medicare Other

## 2017-01-13 DIAGNOSIS — Z5111 Encounter for antineoplastic chemotherapy: Secondary | ICD-10-CM | POA: Diagnosis not present

## 2017-01-13 DIAGNOSIS — C3490 Malignant neoplasm of unspecified part of unspecified bronchus or lung: Secondary | ICD-10-CM

## 2017-01-13 LAB — COMPREHENSIVE METABOLIC PANEL
ALT: 7 U/L — AB (ref 17–63)
AST: 16 U/L (ref 15–41)
Albumin: 3.6 g/dL (ref 3.5–5.0)
Alkaline Phosphatase: 121 U/L (ref 38–126)
Anion gap: 9 (ref 5–15)
BUN: 21 mg/dL — AB (ref 6–20)
CHLORIDE: 94 mmol/L — AB (ref 101–111)
CO2: 26 mmol/L (ref 22–32)
CREATININE: 0.62 mg/dL (ref 0.61–1.24)
Calcium: 9.6 mg/dL (ref 8.9–10.3)
GFR calc Af Amer: >60 mL/min (ref >60)
GFR calc non Af Amer: >60 mL/min (ref >60)
Glucose, Bld: 104 mg/dL — ABNORMAL HIGH (ref 65–99)
POTASSIUM: 4.2 mmol/L (ref 3.5–5.1)
SODIUM: 129 mmol/L — AB (ref 135–145)
Total Bilirubin: 0.5 mg/dL (ref 0.3–1.2)
Total Protein: 7 g/dL (ref 6.5–8.1)

## 2017-01-13 MED ORDER — HEPARIN SOD (PORK) LOCK FLUSH 100 UNIT/ML IV SOLN
500.0000 [IU] | Freq: Once | INTRAVENOUS | Status: AC | PRN
  Administered 2017-01-13: 500 [IU]
  Filled 2017-01-13: qty 5

## 2017-01-13 MED ORDER — DEXAMETHASONE SODIUM PHOSPHATE 10 MG/ML IJ SOLN
10.0000 mg | Freq: Once | INTRAMUSCULAR | Status: AC
  Administered 2017-01-13: 10 mg via INTRAVENOUS
  Filled 2017-01-13: qty 1

## 2017-01-13 MED ORDER — PEGFILGRASTIM 6 MG/0.6ML ~~LOC~~ PSKT
6.0000 mg | PREFILLED_SYRINGE | Freq: Once | SUBCUTANEOUS | Status: AC
  Administered 2017-01-13: 6 mg via SUBCUTANEOUS
  Filled 2017-01-13: qty 0.6

## 2017-01-13 MED ORDER — SODIUM CHLORIDE 0.9 % IV SOLN
Freq: Once | INTRAVENOUS | Status: AC
  Administered 2017-01-13: 14:00:00 via INTRAVENOUS
  Filled 2017-01-13: qty 1000

## 2017-01-13 MED ORDER — ETOPOSIDE CHEMO INJECTION 1 GM/50ML
100.0000 mg/m2 | Freq: Once | INTRAVENOUS | Status: AC
  Administered 2017-01-13: 160 mg via INTRAVENOUS
  Filled 2017-01-13: qty 8

## 2017-01-14 ENCOUNTER — Telehealth: Payer: Self-pay | Admitting: *Deleted

## 2017-01-14 NOTE — Telephone Encounter (Signed)
Per Dr Tasia Catchings, she is ok with hospice, but would like to see the patient one more time first. I called hospice office after being unable to get Steven Davis and they will let her know that Dr Tasia Catchings wants see him one more time. She will be fine with hospice referral then. They will let her know to speak with daughter regarding the appointment Wednesday

## 2017-01-14 NOTE — Telephone Encounter (Signed)
Steven Davis went out to do Palliative Care consult today and patient expresses that he does not want any further treatment, He just wants to enjoy what time her has left with his family and is wanting a hospice referral. Please advise

## 2017-01-19 ENCOUNTER — Inpatient Hospital Stay: Payer: Medicare Other

## 2017-01-19 ENCOUNTER — Inpatient Hospital Stay: Payer: Medicare Other | Admitting: Oncology

## 2017-01-19 ENCOUNTER — Ambulatory Visit: Payer: Medicare Other | Attending: Radiation Oncology | Admitting: Radiation Oncology

## 2017-01-31 ENCOUNTER — Telehealth: Payer: Self-pay | Admitting: Oncology

## 2017-01-31 ENCOUNTER — Telehealth: Payer: Self-pay | Admitting: *Deleted

## 2017-01-31 NOTE — Telephone Encounter (Signed)
Per patient please cancel all appts here at the cancer center.  Patient has decided not to receive treatment and has accepted hospice.

## 2017-01-31 NOTE — Telephone Encounter (Signed)
Cancel all appointments/Tx, per Julie/Patient call msg. (patient request) Per patient, will do HOSPICE.

## 2017-02-01 ENCOUNTER — Inpatient Hospital Stay

## 2017-02-01 ENCOUNTER — Inpatient Hospital Stay: Admitting: Oncology

## 2017-02-02 ENCOUNTER — Inpatient Hospital Stay

## 2017-02-03 ENCOUNTER — Encounter: Payer: Self-pay | Admitting: *Deleted

## 2017-02-03 ENCOUNTER — Ambulatory Visit: Payer: Medicare Other

## 2017-02-03 ENCOUNTER — Other Ambulatory Visit: Payer: Medicare Other

## 2017-02-21 ENCOUNTER — Ambulatory Visit: Payer: Medicare Other | Admitting: Radiation Oncology

## 2017-02-28 ENCOUNTER — Ambulatory Visit: Admitting: Radiation Oncology

## 2017-05-25 DEATH — deceased

## 2019-04-15 IMAGING — MR MR HEAD W/O CM
3 series · 48 of 48 positions shown · non-contrast
Comparison: CT 06/05/2014

CLINICAL DATA: Small cell lung cancer staging

EXAM:
MRI HEAD WITHOUT CONTRAST
TECHNIQUE: Multiplanar, multiecho pulse sequences of the brain and surrounding
structures were obtained without intravenous contrast.

[Series 2: GRE · sagittal · 5.0mm · 0.45mm/px · 10 of 27 slices shown]
[im 1/27]
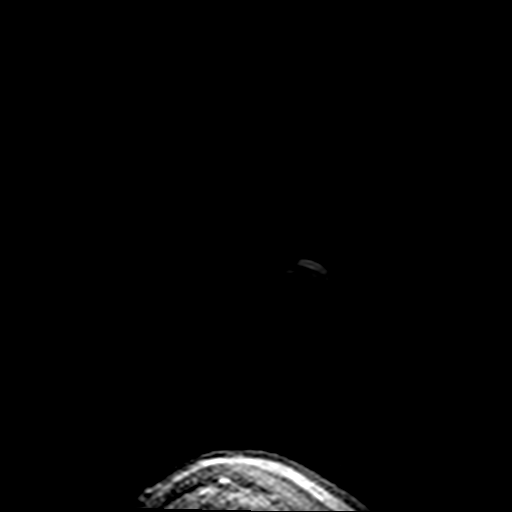
[im 3/27]
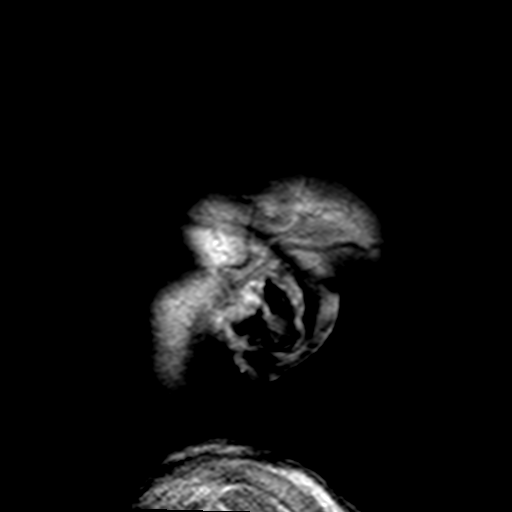
[im 6/27]
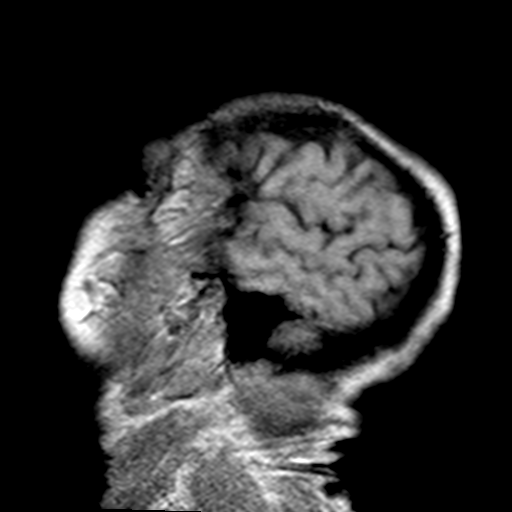
[im 9/27]
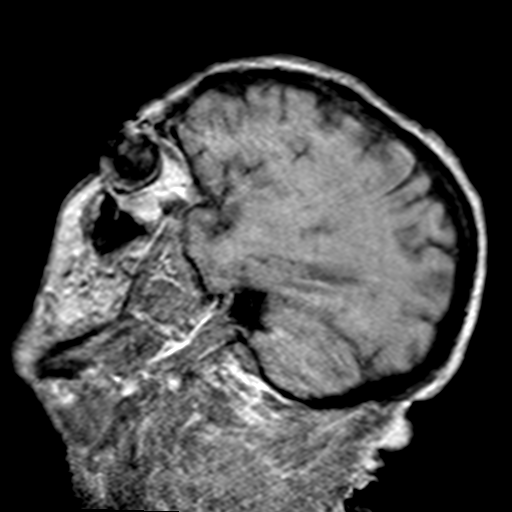
[im 12/27]
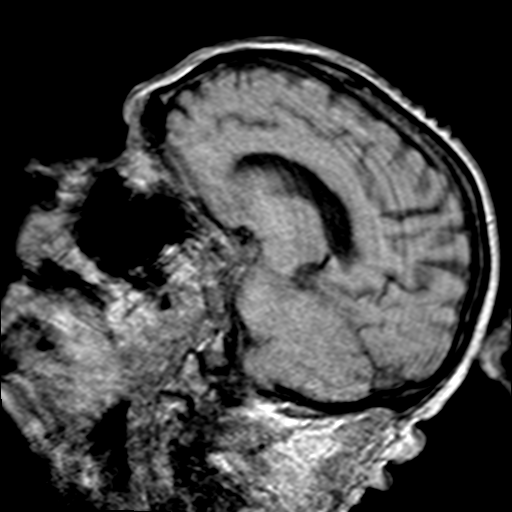
[im 15/27]
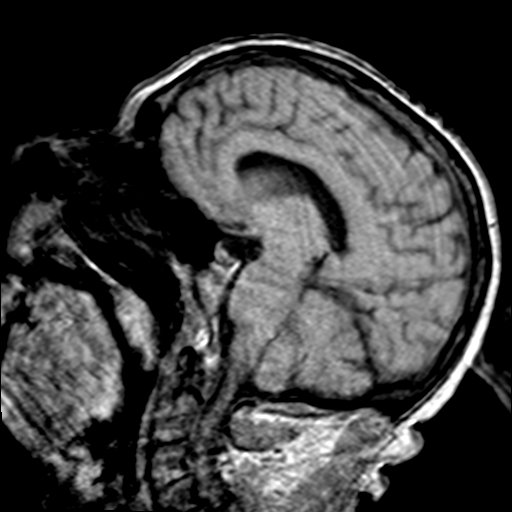
[im 18/27]
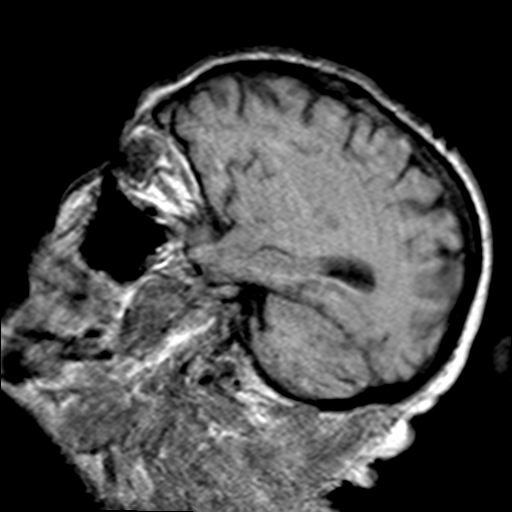
[im 21/27]
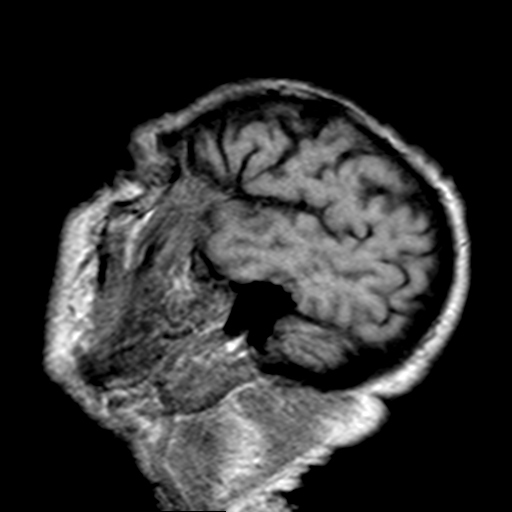
[im 24/27]
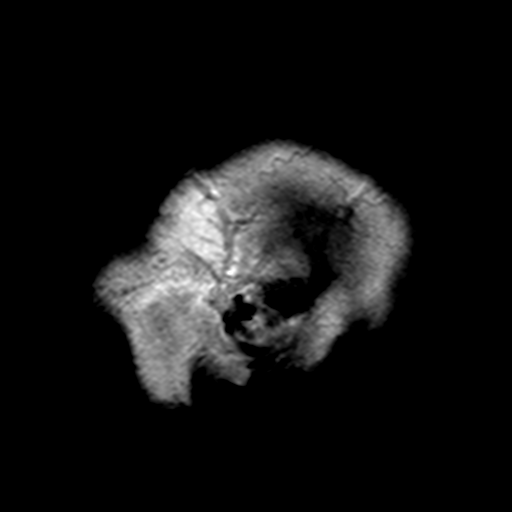
[im 27/27]
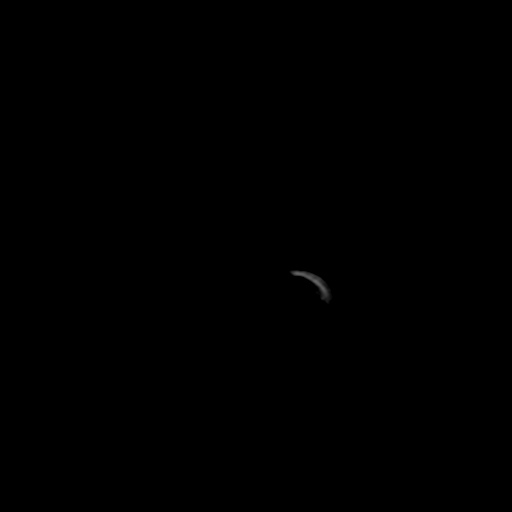

[Series 4: DWI · axial · 3.0mm · 1.80mm/px · z∈[-47,+84]mm · 17 of 46 slices shown]
[im 1/46]
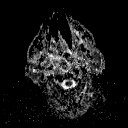
[im 3/46]
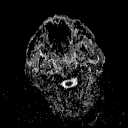
[im 6/46]
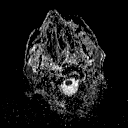
[im 9/46]
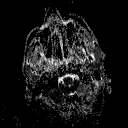
[im 12/46]
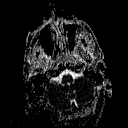
[im 15/46]
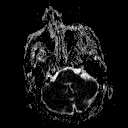
[im 17/46]
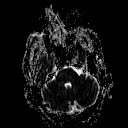
[im 20/46]
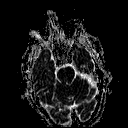
[im 23/46]
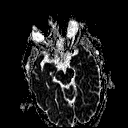
[im 26/46]
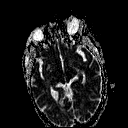
[im 29/46]
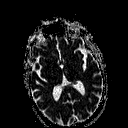
[im 31/46]
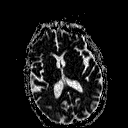
[im 34/46]
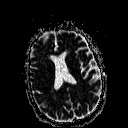
[im 37/46]
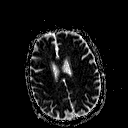
[im 40/46]
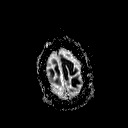
[im 43/46]
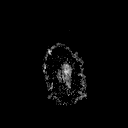
[im 46/46]
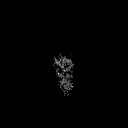

[Series 100: ax (id) · axial · 3.0mm · 1.80mm/px · z∈[-47,+84]mm · 21 of 55 slices shown]
[im 1/55]
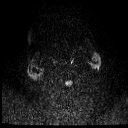
[im 3/55]
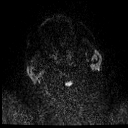
[im 6/55]
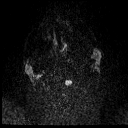
[im 9/55]
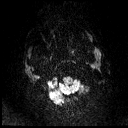
[im 11/55]
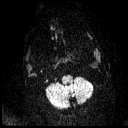
[im 14/55]
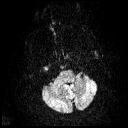
[im 17/55]
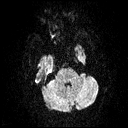
[im 19/55]
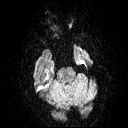
[im 22/55]
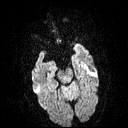
[im 25/55]
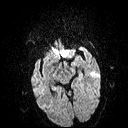
[im 28/55]
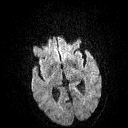
[im 30/55]
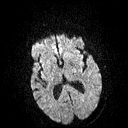
[im 33/55]
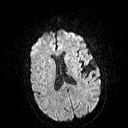
[im 36/55]
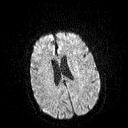
[im 38/55]
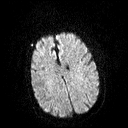
[im 41/55]
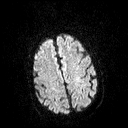
[im 44/55]
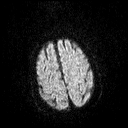
[im 46/55]
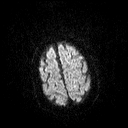
[im 49/55]
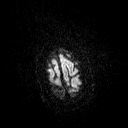
[im 52/55]
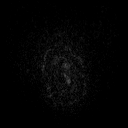
[im 55/55]
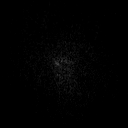

[48 of 48 positions shown; findings below may reference images not displayed]

FINDINGS: Limited study. The patient was not able to hold still due to extreme
discomfort. Limited imaging. No contrast administered

Axial diffusion-weighted imaging is diagnostic. No acute infarct. No
areas of restricted diffusion.

Ventricle size normal.  No mass or fluid collection identified.
IMPRESSION: Limited study. The patient was not able to complete the study and
minimal imaging was performed. Allowing for this no significant
abnormality was identified.

## 2019-04-15 IMAGING — CT CT ABD-PELV W/ CM
2 of 5 series · 15 of 46 positions shown, 17 images · IV contrast (APPLIED)
Comparison: Chest CT 11/23/2016

CLINICAL DATA: History of lung cancer. Shortness-of-breath with
nonproductive cough and hoarseness. Oxygen dependent. Heavy smoker.

EXAM:
CT ABDOMEN AND PELVIS WITH CONTRAST
TECHNIQUE: Multidetector CT imaging of the abdomen and pelvis was performed
using the standard protocol following bolus administration of
intravenous contrast.
CONTRAST:  100mL [id number] IOPAMIDOL ([id number]) INJECTION 61%

[Series 2: routine abd/pel with · axial · 0.80mm/px · z∈[-448,-98]mm · 12 of 80 slices shown, 14 images]
[im 5/80  soft-tissue]
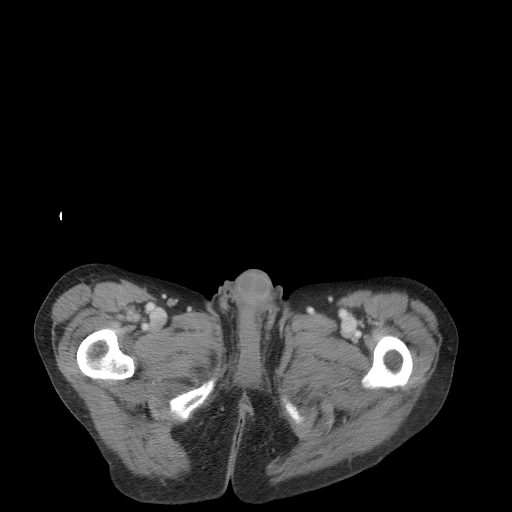
[im 5/80  bone]
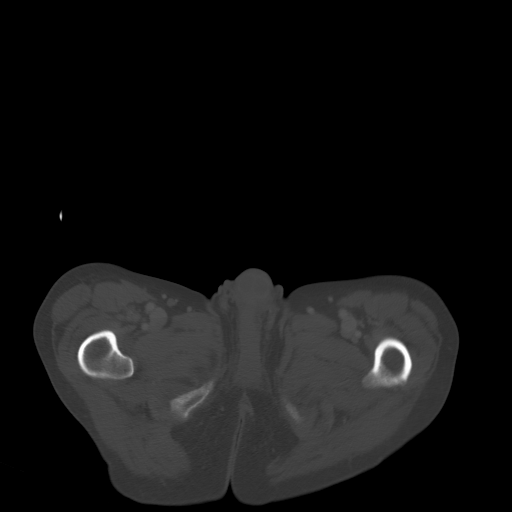
[im 14/80  soft-tissue]
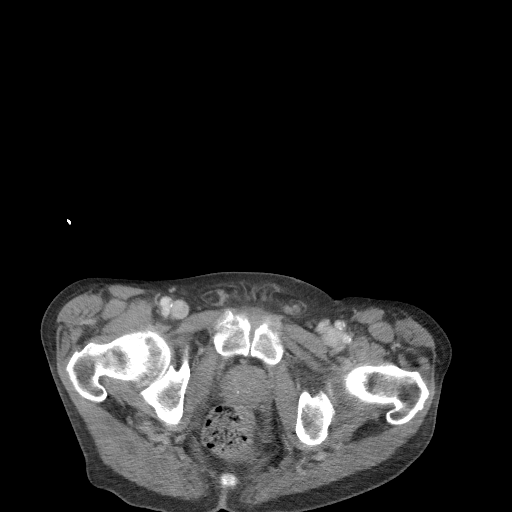
[im 18/80  soft-tissue]
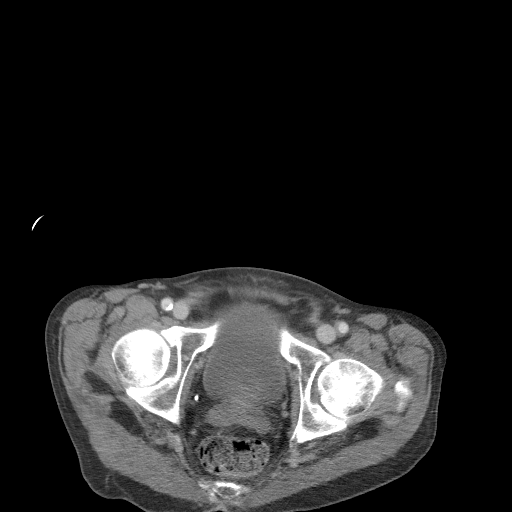
[im 22/80  soft-tissue]
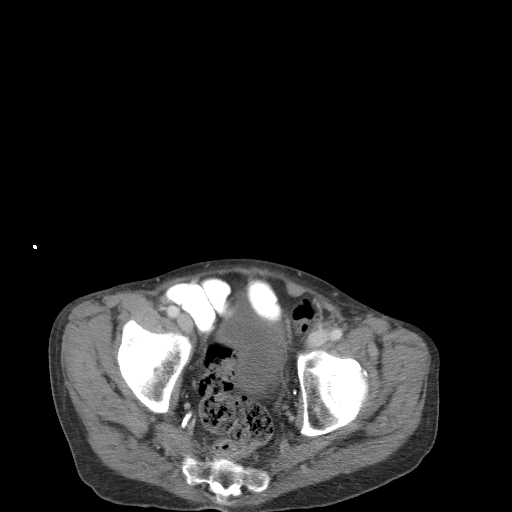
[im 31/80  soft-tissue]
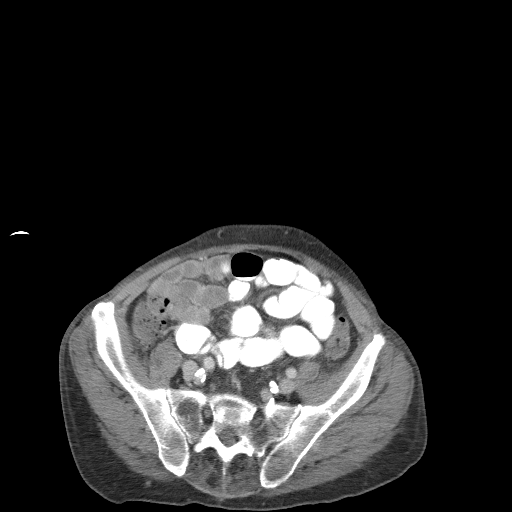
[im 36/80  soft-tissue]
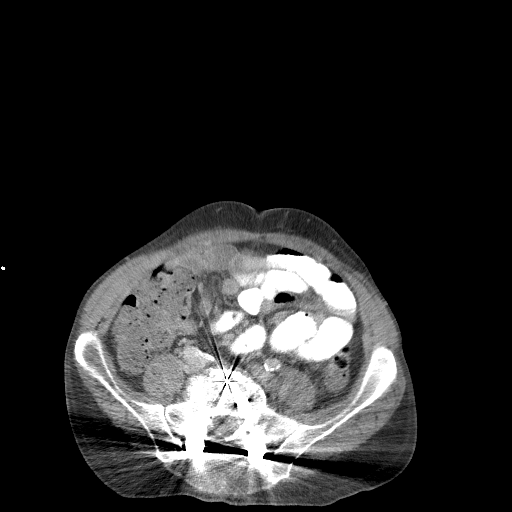
[im 44/80  soft-tissue]
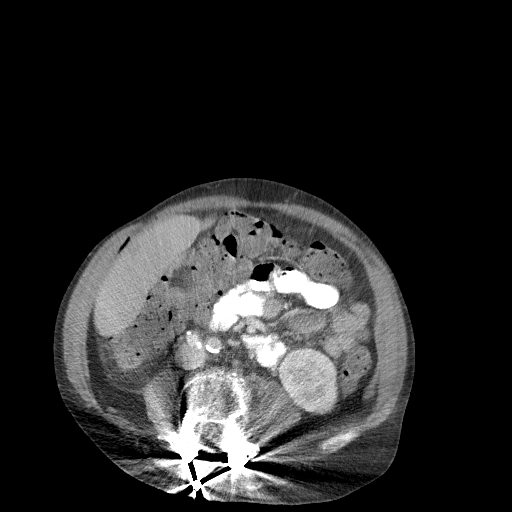
[im 49/80  soft-tissue]
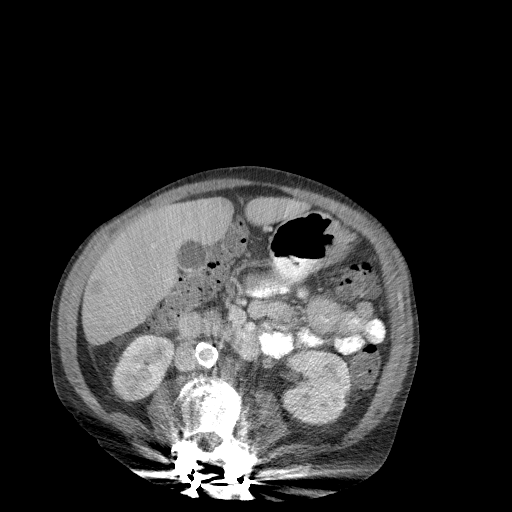
[im 58/80  soft-tissue]
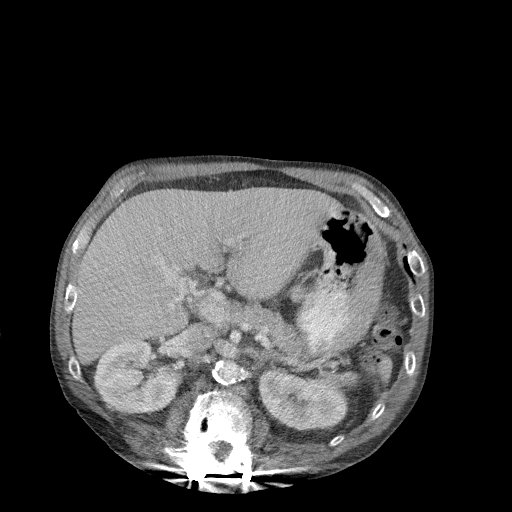
[im 58/80  bone]
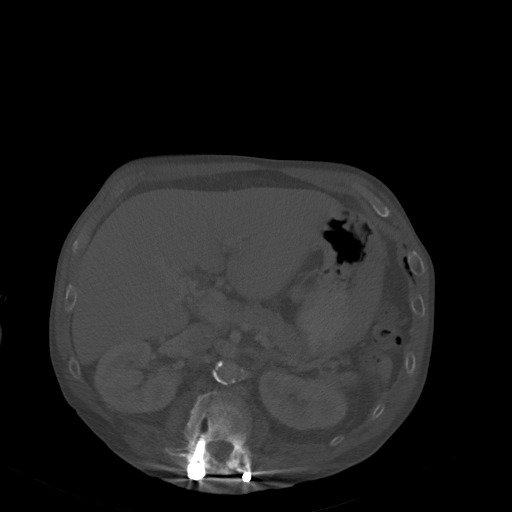
[im 62/80  soft-tissue]
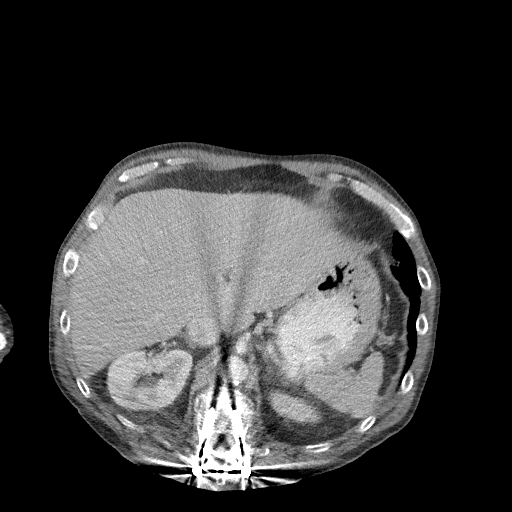
[im 66/80  soft-tissue]
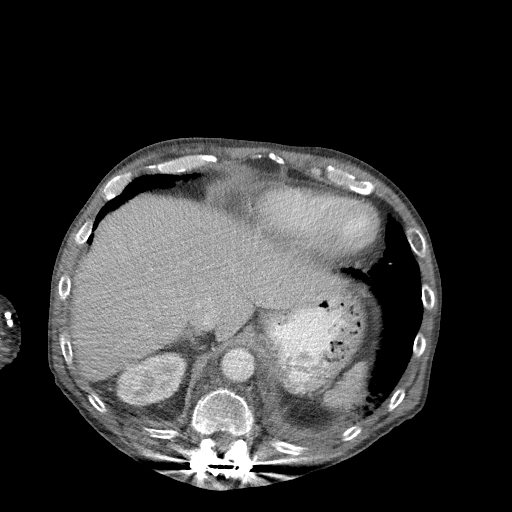
[im 75/80  soft-tissue]
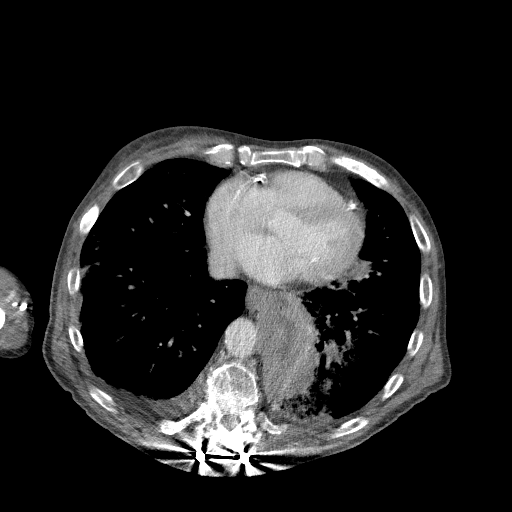

[Series 5: coronal st · coronal · 0.66mm/px · 3 of 98 slices shown]
[im 33/98  soft-tissue]
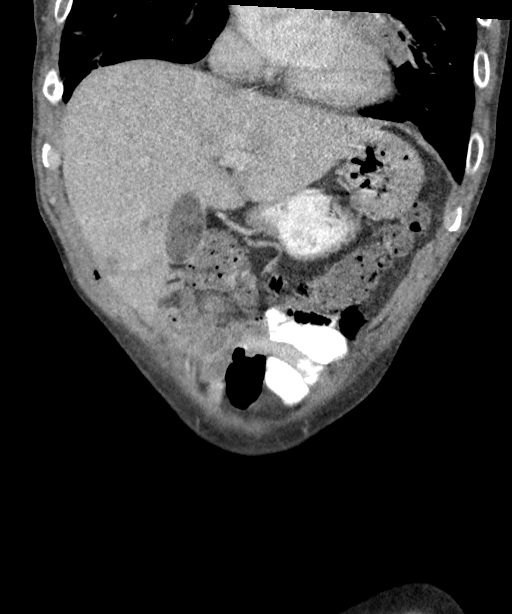
[im 44/98  soft-tissue]
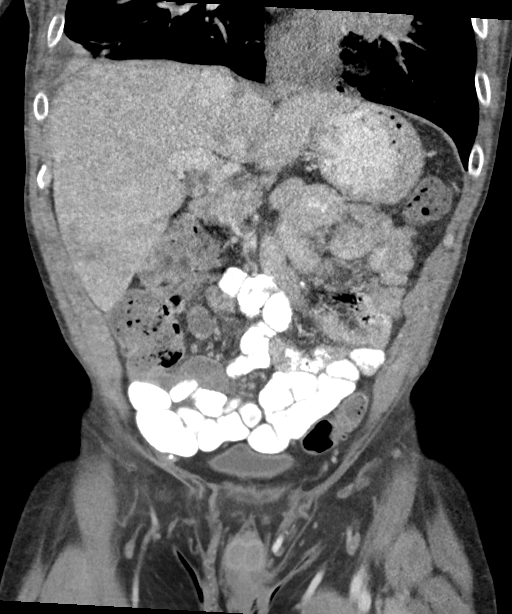
[im 54/98  soft-tissue]
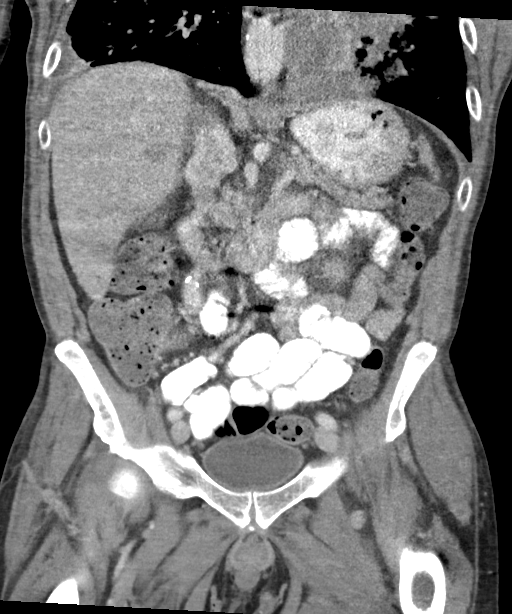

[15 of 46 positions shown; findings below may reference images not displayed]

FINDINGS: Lower chest: No significant change in known large left lower lobe
lung mass presumed to be primary bronchogenic carcinoma. Adjacent
small nodules within the left lower lobe likely metastatic disease.
Patchy bilateral nodular airspace process new since the previous
exam as the acuteness suggests infection and much less likely
progression of patient's neoplastic disease. Small amount of
bilateral pleural fluid right greater than left which is new.
Calcified plaque over the left anterior descending and right
coronary arteries.

Hepatobiliary: 7 small hypodense liver lesions most not well seen on
prior exam as the largest is over the right lobe measuring 1.7 cm.
This is likely due to metastatic disease. Minimal sludge versus
cholelithiasis. Biliary tree is within normal.

Pancreas: Within normal.

Spleen: Within normal.

Adrenals/Urinary Tract: Adrenal glands are normal. Kidneys normal in
size without hydronephrosis or nephrolithiasis. Ureters and bladder
are normal.

Stomach/Bowel: Stomach and small bowel are unremarkable. Appendix is
normal. Moderate fecal retention throughout the colon.

Vascular/Lymphatic: Moderate calcified plaque over the abdominal
aorta and iliac arteries. No definite adenopathy within the
abdomen/pelvis.

Reproductive: Within normal.

Other: No free fluid or focal inflammatory change.

Musculoskeletal: Moderate spondylosis of the spine with curvature of
the thoracolumbar spine convex right and multilevel disc disease.
Fusion hardware is present throughout the visualized thoracolumbar
spine which is intact and unchanged. Multiple stable compression
fractures over the visualized thoracolumbar spine.
IMPRESSION: Stable partially visualized large left lower lobe mass with several
small adjacent nodules likely adjacent metastatic disease.
Approximately 7 small hypodense liver lesions not well seen on the
prior exam with the largest 1.7 cm over the right lobe compatible
with metastatic disease.

New patchy nodular airspace process over the mid to lower lungs
right worse than left as the q.d. suggest infection and much less
likely progression of neoplastic disease. New small bilateral
pleural effusions right greater than left.

Aortic Atherosclerosis (2ERH5-RSE.E). Atherosclerotic coronary
artery disease.

Minimal sludge versus cholelithiasis.

Moderate spondylosis of the spine with multilevel disc disease and
multiple stable compression fractures. Posterior fusion hardware
intact over the thoracolumbar spine.
